# Patient Record
Sex: Female | Born: 1935 | Race: White | Hispanic: No | Smoking: Never smoker
Health system: Southern US, Community
[De-identification: ages and names within clinical notes are randomized; demographics above are authoritative.]

## PROBLEM LIST (undated history)

## (undated) DIAGNOSIS — Z9289 Personal history of other medical treatment: Secondary | ICD-10-CM

## (undated) DIAGNOSIS — K260 Acute duodenal ulcer with hemorrhage: Secondary | ICD-10-CM

## (undated) DIAGNOSIS — K635 Polyp of colon: Secondary | ICD-10-CM

## (undated) DIAGNOSIS — R351 Nocturia: Secondary | ICD-10-CM

## (undated) DIAGNOSIS — I10 Essential (primary) hypertension: Secondary | ICD-10-CM

## (undated) DIAGNOSIS — Z952 Presence of prosthetic heart valve: Secondary | ICD-10-CM

## (undated) DIAGNOSIS — D649 Anemia, unspecified: Secondary | ICD-10-CM

## (undated) DIAGNOSIS — K250 Acute gastric ulcer with hemorrhage: Secondary | ICD-10-CM

## (undated) DIAGNOSIS — J45909 Unspecified asthma, uncomplicated: Secondary | ICD-10-CM

## (undated) DIAGNOSIS — M1990 Unspecified osteoarthritis, unspecified site: Secondary | ICD-10-CM

## (undated) DIAGNOSIS — J189 Pneumonia, unspecified organism: Secondary | ICD-10-CM

## (undated) DIAGNOSIS — K219 Gastro-esophageal reflux disease without esophagitis: Secondary | ICD-10-CM

## (undated) DIAGNOSIS — R011 Cardiac murmur, unspecified: Secondary | ICD-10-CM

## (undated) HISTORY — PX: DILATION AND CURETTAGE OF UTERUS: SHX78

## (undated) HISTORY — PX: CATARACT EXTRACTION: SUR2

## (undated) HISTORY — PX: CARDIAC CATHETERIZATION: SHX172

---

## 1998-09-03 ENCOUNTER — Other Ambulatory Visit: Admission: RE | Admit: 1998-09-03 | Discharge: 1998-09-03 | Payer: Self-pay | Admitting: Urology

## 1998-09-04 ENCOUNTER — Other Ambulatory Visit: Admission: RE | Admit: 1998-09-04 | Discharge: 1998-09-04 | Payer: Self-pay | Admitting: Urology

## 2006-08-21 ENCOUNTER — Ambulatory Visit: Payer: Self-pay | Admitting: Vascular Surgery

## 2007-10-22 ENCOUNTER — Ambulatory Visit: Payer: Self-pay | Admitting: Vascular Surgery

## 2008-01-21 ENCOUNTER — Ambulatory Visit: Payer: Self-pay | Admitting: Vascular Surgery

## 2008-02-16 ENCOUNTER — Ambulatory Visit: Payer: Self-pay | Admitting: Vascular Surgery

## 2008-02-24 ENCOUNTER — Ambulatory Visit: Payer: Self-pay | Admitting: Vascular Surgery

## 2008-04-05 ENCOUNTER — Ambulatory Visit: Payer: Self-pay | Admitting: Vascular Surgery

## 2008-04-19 ENCOUNTER — Ambulatory Visit: Payer: Self-pay | Admitting: Vascular Surgery

## 2010-07-23 NOTE — Consult Note (Signed)
NEW PATIENT CONSULTATION   [name], [name]  DOB:  1928-11-01                                       08/21/2006  NWGNF#:[id number]   [name] presents today for evaluation of bilateral venous  pathology.  She is a very pleasant, healthy 77 year old white female  with a long history of progressive venous varicosities. She has had no  history of prior deep venous thrombosis, ulceration or bleeding.  She  had attempted saline injections for sclerotherapy 10 years ago with no  significant success.  She does not have any pain over the varicosities  and really does not have any significant leg pain bilaterally.   PAST MEDICAL HISTORY:  Significant for being married, three children.  She does not smoke or drink alcohol. She does have some arthritis and  hypertension.  She has no history of arterial insufficiency and no  history of cardiac disease.   PHYSICAL EXAMINATION:  GENERAL:  A well-developed, well-nourished white  female who appears her age of 67.  VITAL SIGNS:  Blood pressure 160/92, pulse 84, respiration 16.  Her  dorsalis pedis pulses are 2+ bilaterally. 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  She does have some very  pronounced telangiectasia in both anterior thighs and also scattered  telangiectasia throughout both legs.  She underwent hand held duplex  evaluation by me and this revealed gross reflux bilaterally in her  saphenous veins.   I discussed options with [name].  I explained that indications for  treatment would be pain or appearance.  I explained that this is not  leading her to any increased risk of deep venous thrombosis and any  significant medical problems as this would be slowly progressive.  She  is concerned regarding the appearance of these.  We discussed laser  ablation and stab phlebectomy of her tributary varicosities and  saline  injections.  Since she is not having any pain associated with these, we  explained that this would be out of pocket expense.  She wishes to defer  treatment at this time and was reassured that there is not major medical  difficulties regarding these.  She will notify us if she wishes to  proceed in the future or if she begins having discomfort related to her  varicosities.   Larina Earthly, M.D.  Electronically Signed   TFE/MEDQ  D:  08/21/2006  T:  08/21/2006  Job:  [id number]

## 2010-07-23 NOTE — Procedures (Signed)
DUPLEX DEEP VENOUS EXAM - LOWER EXTREMITY   INDICATION:  Right greater saphenous vein ablation on 02/16/2008.   HISTORY:  Edema:  No.  Trauma/Surgery:  Right greater saphenous vein ablation in December,  2009.  Pain:  Right leg.  PE:  No.  Previous DVT:  No.  Anticoagulants:  No.  Other:  No.   DUPLEX EXAM:                CFV   SFV   PopV  PTV    GSV                R  L  R  L  R  L  R   L  R  L  Thrombosis    o  o  o     o     o      +  o  Spontaneous   +  +  +     +     +      o  +  Phasic        +  +  +     +     +      o  +  Augmentation  +  +  +     +     +      o  +  Compressible  +  +  +     +     +      o  +  Competent     +  +  +     +     +      o  o   Legend:  + - yes  o - no  p - partial  D - decreased   IMPRESSION:  1. The right greater saphenous vein is thrombosed from the      saphenofemoral junction into the proximal calf.  2. No evidence of right leg deep venous thrombosis.  No deep venous      thrombosis seen in the right common femoral vein or circumflex      vein.  3. The left greater saphenous vein is incompetent.    _____________________________  Larina Earthly, M.D.   MC/MEDQ  D:  02/24/2008  T:  02/24/2008  Job:  [id number]

## 2010-07-23 NOTE — Procedures (Signed)
DUPLEX DEEP VENOUS EXAM - LOWER EXTREMITY   INDICATION:  Follow up left greater saphenous ablation.   HISTORY:  Edema:  No.  Trauma/Surgery:  Yes.  Pain:  No.  PE:  No.  Previous DVT:  No.  Anticoagulants:  Other:   DUPLEX EXAM:                CFV   SFV   PopV  PTV    GSV                R  L  R  L  R  L  R   L  R  L  Thrombosis    o  o     o     o      o     +  Spontaneous   +  +     +     +      +     0  Phasic        +  +     +     +      +     0  Augmentation  +  +     +     +      +     0  Compressible  +  +     +     +      +     0  Competent     +  +     +     +      +     0   Legend:  + - yes  o - no  p - partial  D - decreased   IMPRESSION:  1. No evidence of deep venous thrombosis noted in the left leg.  2. The left greater saphenous vein appears ablated from saphenofemoral      junction to distal thigh.    _____________________________  Larina Earthly, M.D.   MG/MEDQ  D:  04/19/2008  T:  04/19/2008  Job:  [id number]

## 2010-07-23 NOTE — Assessment & Plan Note (Signed)
OFFICE VISIT   CHAUNDRA, ABREU  DOB:  1928/03/25                                       01/21/2008  UEAVW#:[id number]   The patient presents today for continued followup of her venous  pathology.  She is a very pleasant 77 year old white female with a long  history of progressive venous disease.  I had seen her 3 months ago  after she had a significant bleed from a pretibial varicosity on the  right leg.  She has had no further bleeding, but does have several areas  on her pretibial area that are extremely thin that look as though they  would be prone to hemorrhage.  I again discussed treatment acutely if  she does have bleeding.  She does report that she has had a heavy  sensation and pain bilaterally, more so in her calves with prolonged  standing, making ADL's difficult.  It's also difficult for her to get  into a squatting postion, which makes housework and yardwork difficult.  Leg pain prevents her from getting a good night's sleep.  Also, sitting  is very painful making traveling in a car very difficult. She has  tributary varicosities over her left thigh, lateral knee, and pretibial  area.  She underwent a formal duplex today, and this confirms gross  reflux throughout her saphenous vein bilaterally.  She also has an  anterior branch on the left leg, which is feeding into the tributary  varicosities.  I discussed options with the patient.  I recommend that  we proceed with right leg laser ablation and sclerotherapy of these  areas of very superficial reticular veins to hopefully reduce her risk  for recurrent bleeding.  I have also explained the option of left leg  ablation for pain relief.  She understands this and we will proceed  initially with right leg followed by left leg treatment.   Larina Earthly, M.D.  Electronically Signed   TFE/MEDQ  D:  01/21/2008  T:  01/24/2008  Job:  [id number]   cc:   Isabelle Course

## 2010-07-23 NOTE — Procedures (Signed)
LOWER EXTREMITY VENOUS REFLUX EXAM   INDICATION:  Bilateral legs varicose vein with pain and swelling.   EXAM:  Using color-flow imaging and pulse Doppler spectral analysis, the  right and left common femoral vein, superficial femoral, popliteal,  posterior tibial, greater and lesser saphenous veins are evaluated.  There is no evidence suggesting deep venous insufficiency in the right  and left lower extremity.   The right saphenofemoral junction is not competent.  The right and left  GSV is not competent with the caliber as described below.   The right and left proximal short saphenous veins demonstrate  competency.   GSV Diameter (used if found to be incompetent only)                                            Right    Left  Proximal Greater Saphenous Vein           0.60 cm  0.38 cm  Proximal-to-mid-thigh                     0.60 cm  0.38 cm  Mid thigh                                 0.47 cm  0.37 cm  Mid-distal thigh                          0.47 cm  0.37 cm  Distal thigh                              0.47 cm  0.37 cm  Knee                                      0.35 cm  0.37 cm   IMPRESSION:  1. Right and left greater saphenous vein reflux is identified with the      caliber ranging from on the right 0.35 cm to 1.03 cm knee to groin      and on the left is 0.37 cm to 0.70 cm knee-to-groin.  2. The right and left greater saphenous vein is not aneurysmal.  3. The right and left greater saphenous vein is not tortuous.  4. The deep venous system is competent.  5. The right and left lesser saphenous vein is competent.  6. No evidence of DVT noted in bilateral legs.       ___________________________________________  Larina Earthly, M.D.   MG/MEDQ  D:  01/21/2008  T:  01/21/2008  Job:  [id number]

## 2010-07-23 NOTE — Assessment & Plan Note (Signed)
OFFICE VISIT   PETRITA, BLUNCK  DOB:  [birth year]                                       04/19/2008  [id number]#:[id number]   The patient presents today for 2 week followup after laser ablation and  stab phlebectomy of her left great saphenous vein and tributary  varicosities.  She had undergone a right leg treatment on 02/16/2008.  She has done quite well with her left leg just as she did her right.  She had multiple stab phlebectomies and these incisions are all healing  quite nicely.  She also had some sclerotherapy to the anterior thighs  bilaterally and these are resolving as expected 2 weeks out.  She did  have a slight amount of blood trapping in the sclerotherapy sites and  this was evacuated with puncture of an 18 gauge needle.  The patient is  quite pleased with her result as am I.  She will continue her usual  activities and I plan to see her again on an as-needed basis.   Larina Earthly, M.D.  Electronically Signed   TFE/MEDQ  D:  04/19/2008  T:  04/20/2008  Job:  [id number]   cc:   Isabelle Course

## 2010-07-23 NOTE — Assessment & Plan Note (Signed)
OFFICE VISIT   [name], [name]  DOB:  1928/10/02                                       10/22/2007  EAVWU#:[id number]   The patient presents for a continued evaluation of her severe venous  hypertension.  Since my last visit with her, she has had progression.  She had a recent event of a major bleed from her varix at her right  pretibial area in the shower.  This was controlled eventually with  compression.  She has multiple areas in this region that appear to be  prone to bleeding, with very thin skin over a very superficial prominent  varix.  She does have more prominent standard type varicosities over her  left leg, throughout her distal thigh and proximal calf.  She does  report aching sensation with prolonged standing and this is equal right  and left.  She does elevate her legs when possible and this gives slight  relief.  She has not worn graduated compression garments.  She reports a  tired heavy sensation with throbbing and itching sensation and skin  changes over her legs bilaterally.   MEDICATIONS:  Currently are Evista, hydrochlorothiazide, Toprol, and  Tylenol for discomfort.   She is allergic to penicillin.   PHYSICAL EXAMINATION:  A well-developed, well-nourished white female  appearing younger than stated age of 26.  Her blood pressure 147/86,  heart rate is 104, respirations 18.  Her dorsalis pedis pulses are 2+  bilaterally.  She does have marked saphenous vein tributary varicosities  bilaterally and multiple spider vein telangiectasia.  The area of prior  bleed on the right pretibial area is healing with a scab present.  I  discussed options with the patient.  We have fitted her with graduated  compression stockings, thigh-high, 20-30 mmHg a day and instructed her  on their use.  We will see her again in 3 months to determine if this  conservative treatment is improving her symptoms and is preventing  recurrent bleeding.  I instructed  her on care should she develop ongoing  bleeding as well.   Larina Earthly, M.D.  Electronically Signed   TFE/MEDQ  D:  10/22/2007  T:  10/25/2007  Job:  [id number]   cc:   Isabelle Course

## 2010-07-23 NOTE — Assessment & Plan Note (Signed)
OFFICE VISIT   [name], [name]  DOB:  [birth year]                                       02/24/2008  [id number]#:[id number]   The patient presents today for 1 week followup of her laser ablation of  her right great saphenous vein and sclerotherapy treatment of the areas  of the tributary varicosities that had bled in her distal calf.  She has  done quite well.  She has the usual amount of soreness from the  procedure.  She underwent repeat duplex today and this reveals closure  of her saphenous vein from the mid calf up to the saphenofemoral  junction and no evidence of deep venous thrombosis.  I am quite pleased  with her initial result as is the patient.  She continues to have pain  in her left leg.  She has not had any bleeding but does have pain  associated with prolonged standing over the significant varicosities  over her anterior thigh and anterior pretibial area.  She continues to  have pain despite the use of graduated compression garments.  I have  recommended that we proceed with laser ablation of her great saphenous  vein for reduction of her venous hypertension and stab phlebectomy of  the tributary varicosities.  She wishes to proceed with this after the  first of the year.   Larina Earthly, M.D.  Electronically Signed   TFE/MEDQ  D:  02/24/2008  T:  02/24/2008  Job:  [id number]

## 2012-11-18 HISTORY — PX: TISSUE AORTIC VALVE REPLACEMENT: SHX2527

## 2013-01-17 ENCOUNTER — Encounter (HOSPITAL_COMMUNITY): Payer: Self-pay | Admitting: Emergency Medicine

## 2013-01-17 ENCOUNTER — Inpatient Hospital Stay (HOSPITAL_COMMUNITY)
Admission: EM | Admit: 2013-01-17 | Discharge: 2013-01-19 | DRG: 378 | Disposition: A | Discharge status: Home / Self Care | Payer: Medicare Other | Attending: Family Medicine | Admitting: Family Medicine

## 2013-01-17 DIAGNOSIS — Z8052 Family history of malignant neoplasm of bladder: Secondary | ICD-10-CM

## 2013-01-17 DIAGNOSIS — K260 Acute duodenal ulcer with hemorrhage: Secondary | ICD-10-CM | POA: Diagnosis present

## 2013-01-17 DIAGNOSIS — Z860100 Personal history of colon polyps, unspecified: Secondary | ICD-10-CM

## 2013-01-17 DIAGNOSIS — K922 Gastrointestinal hemorrhage, unspecified: Secondary | ICD-10-CM | POA: Diagnosis present

## 2013-01-17 DIAGNOSIS — I2510 Atherosclerotic heart disease of native coronary artery without angina pectoris: Secondary | ICD-10-CM | POA: Diagnosis present

## 2013-01-17 DIAGNOSIS — N179 Acute kidney failure, unspecified: Secondary | ICD-10-CM | POA: Diagnosis present

## 2013-01-17 DIAGNOSIS — K257 Chronic gastric ulcer without hemorrhage or perforation: Secondary | ICD-10-CM | POA: Diagnosis present

## 2013-01-17 DIAGNOSIS — J309 Allergic rhinitis, unspecified: Secondary | ICD-10-CM | POA: Diagnosis present

## 2013-01-17 DIAGNOSIS — Z79899 Other long term (current) drug therapy: Secondary | ICD-10-CM

## 2013-01-17 DIAGNOSIS — J329 Chronic sinusitis, unspecified: Secondary | ICD-10-CM | POA: Diagnosis present

## 2013-01-17 DIAGNOSIS — IMO0002 Reserved for concepts with insufficient information to code with codable children: Secondary | ICD-10-CM | POA: Diagnosis present

## 2013-01-17 DIAGNOSIS — K250 Acute gastric ulcer with hemorrhage: Secondary | ICD-10-CM | POA: Diagnosis present

## 2013-01-17 DIAGNOSIS — Z602 Problems related to living alone: Secondary | ICD-10-CM

## 2013-01-17 DIAGNOSIS — D62 Acute posthemorrhagic anemia: Secondary | ICD-10-CM | POA: Diagnosis present

## 2013-01-17 DIAGNOSIS — Z954 Presence of other heart-valve replacement: Secondary | ICD-10-CM

## 2013-01-17 DIAGNOSIS — I10 Essential (primary) hypertension: Secondary | ICD-10-CM | POA: Diagnosis present

## 2013-01-17 DIAGNOSIS — T39095A Adverse effect of salicylates, initial encounter: Secondary | ICD-10-CM | POA: Diagnosis present

## 2013-01-17 DIAGNOSIS — Z880 Allergy status to penicillin: Secondary | ICD-10-CM

## 2013-01-17 DIAGNOSIS — R55 Syncope and collapse: Secondary | ICD-10-CM | POA: Diagnosis present

## 2013-01-17 DIAGNOSIS — K269 Duodenal ulcer, unspecified as acute or chronic, without hemorrhage or perforation: Secondary | ICD-10-CM | POA: Diagnosis present

## 2013-01-17 DIAGNOSIS — Z8249 Family history of ischemic heart disease and other diseases of the circulatory system: Secondary | ICD-10-CM

## 2013-01-17 DIAGNOSIS — W19XXXA Unspecified fall, initial encounter: Secondary | ICD-10-CM | POA: Diagnosis present

## 2013-01-17 DIAGNOSIS — E785 Hyperlipidemia, unspecified: Secondary | ICD-10-CM | POA: Diagnosis present

## 2013-01-17 DIAGNOSIS — Z9849 Cataract extraction status, unspecified eye: Secondary | ICD-10-CM

## 2013-01-17 DIAGNOSIS — K2961 Other gastritis with bleeding: Principal | ICD-10-CM | POA: Diagnosis present

## 2013-01-17 DIAGNOSIS — Z952 Presence of prosthetic heart valve: Secondary | ICD-10-CM

## 2013-01-17 DIAGNOSIS — K921 Melena: Secondary | ICD-10-CM | POA: Diagnosis present

## 2013-01-17 DIAGNOSIS — Y92009 Unspecified place in unspecified non-institutional (private) residence as the place of occurrence of the external cause: Secondary | ICD-10-CM

## 2013-01-17 DIAGNOSIS — E861 Hypovolemia: Secondary | ICD-10-CM | POA: Diagnosis present

## 2013-01-17 DIAGNOSIS — K920 Hematemesis: Secondary | ICD-10-CM | POA: Diagnosis present

## 2013-01-17 HISTORY — DX: Acute gastric ulcer with hemorrhage: K25.0

## 2013-01-17 HISTORY — DX: Pneumonia, unspecified organism: J18.9

## 2013-01-17 HISTORY — DX: Acute duodenal ulcer with hemorrhage: K26.0

## 2013-01-17 HISTORY — DX: Polyp of colon: K63.5

## 2013-01-17 HISTORY — DX: Unspecified osteoarthritis, unspecified site: M19.90

## 2013-01-17 HISTORY — DX: Essential (primary) hypertension: I10

## 2013-01-17 LAB — CBC WITH DIFFERENTIAL/PLATELET
Basophils Absolute: 0.1 10*3/uL (ref 0.0–0.1)
Basophils Relative: 1 % (ref 0–1)
Eosinophils Absolute: 0 10*3/uL (ref 0.0–0.7)
Eosinophils Relative: 0 % (ref 0–5)
HCT: 26.1 % — ABNORMAL LOW (ref 36.0–46.0)
Hemoglobin: 8.5 g/dL — ABNORMAL LOW (ref 12.0–15.0)
Lymphocytes Relative: 6 % — ABNORMAL LOW (ref 12–46)
Lymphs Abs: 0.5 10*3/uL — ABNORMAL LOW (ref 0.7–4.0)
MCH: 28.3 pg (ref 26.0–34.0)
MCHC: 32.6 g/dL (ref 30.0–36.0)
MCV: 87 fL (ref 78.0–100.0)
Monocytes Absolute: 0.3 10*3/uL (ref 0.1–1.0)
Monocytes Relative: 4 % (ref 3–12)
Neutro Abs: 8.1 10*3/uL — ABNORMAL HIGH (ref 1.7–7.7)
Neutrophils Relative %: 90 % — ABNORMAL HIGH (ref 43–77)
Platelets: 376 10*3/uL (ref 150–400)
RBC: 3 MIL/uL — ABNORMAL LOW (ref 3.87–5.11)
RDW: 14.7 % (ref 11.5–15.5)
WBC: 9 10*3/uL (ref 4.0–10.5)

## 2013-01-17 LAB — URINALYSIS, ROUTINE W REFLEX MICROSCOPIC
Bilirubin Urine: NEGATIVE
Glucose, UA: NEGATIVE mg/dL
Ketones, ur: NEGATIVE mg/dL
Nitrite: NEGATIVE
Protein, ur: NEGATIVE mg/dL
Specific Gravity, Urine: 1.015 (ref 1.005–1.030)
Urobilinogen, UA: 0.2 mg/dL (ref 0.0–1.0)
pH: 6 (ref 5.0–8.0)

## 2013-01-17 LAB — URINE MICROSCOPIC-ADD ON

## 2013-01-17 LAB — COMPREHENSIVE METABOLIC PANEL
ALT: 14 U/L (ref 0–35)
AST: 28 U/L (ref 0–37)
Albumin: 3.1 g/dL — ABNORMAL LOW (ref 3.5–5.2)
Alkaline Phosphatase: 42 U/L (ref 39–117)
BUN: 35 mg/dL — ABNORMAL HIGH (ref 6–23)
CO2: 22 mEq/L (ref 19–32)
Calcium: 9.2 mg/dL (ref 8.4–10.5)
Chloride: 107 mEq/L (ref 96–112)
Creatinine, Ser: 1.21 mg/dL — ABNORMAL HIGH (ref 0.50–1.10)
GFR calc Af Amer: 47 mL/min — ABNORMAL LOW (ref >90)
GFR calc non Af Amer: 41 mL/min — ABNORMAL LOW (ref >90)
Glucose, Bld: 116 mg/dL — ABNORMAL HIGH (ref 70–99)
Potassium: 4.5 mEq/L (ref 3.5–5.1)
Sodium: 140 mEq/L (ref 135–145)
Total Bilirubin: 0.3 mg/dL (ref 0.3–1.2)
Total Protein: 6.9 g/dL (ref 6.0–8.3)

## 2013-01-17 LAB — OCCULT BLOOD, POC DEVICE: Fecal Occult Bld: POSITIVE — AB

## 2013-01-17 LAB — ABO/RH: ABO/RH(D): A NEG

## 2013-01-17 LAB — PROTIME-INR
INR: 1.24 (ref 0.00–1.49)
Prothrombin Time: 15.3 seconds — ABNORMAL HIGH (ref 11.6–15.2)

## 2013-01-17 LAB — LIPASE, BLOOD: Lipase: 35 U/L (ref 11–59)

## 2013-01-17 LAB — APTT: aPTT: 28 seconds (ref 24–37)

## 2013-01-17 MED ORDER — SODIUM CHLORIDE 0.9 % IV SOLN
80.0000 mg | Freq: Once | INTRAVENOUS | Status: AC
  Administered 2013-01-17: 80 mg via INTRAVENOUS
  Filled 2013-01-17: qty 80

## 2013-01-17 MED ORDER — ONDANSETRON HCL 4 MG PO TABS: 4.0000 mg | ORAL_TABLET | Freq: Four times a day (QID) | ORAL | Status: DC | PRN

## 2013-01-17 MED ORDER — SODIUM CHLORIDE 0.9 % IV SOLN
80.0000 mg | Freq: Once | INTRAVENOUS | Status: DC
  Administered 2013-01-17: 80 mg via INTRAVENOUS
  Filled 2013-01-17: qty 80

## 2013-01-17 MED ORDER — PANTOPRAZOLE SODIUM 40 MG IV SOLR: 40.0000 mg | Freq: Two times a day (BID) | INTRAVENOUS | Status: DC

## 2013-01-17 MED ORDER — SODIUM CHLORIDE 0.9 % IV SOLN
INTRAVENOUS | Status: DC
  Administered 2013-01-17: 20:00:00 via INTRAVENOUS

## 2013-01-17 MED ORDER — ONDANSETRON HCL 4 MG/2ML IJ SOLN: 4.0000 mg | Freq: Four times a day (QID) | INTRAMUSCULAR | Status: DC | PRN

## 2013-01-17 MED ORDER — SODIUM CHLORIDE 0.9 % IJ SOLN
3.0000 mL | Freq: Two times a day (BID) | INTRAMUSCULAR | Status: DC
  Administered 2013-01-18 – 2013-01-19 (×2): 3 mL via INTRAVENOUS

## 2013-01-17 MED ORDER — ACETAMINOPHEN 325 MG PO TABS
650.0000 mg | ORAL_TABLET | Freq: Once | ORAL | Status: AC
  Administered 2013-01-17: 650 mg via ORAL
  Filled 2013-01-17: qty 2

## 2013-01-17 MED ORDER — METOPROLOL TARTRATE 50 MG PO TABS
50.0000 mg | ORAL_TABLET | Freq: Every day | ORAL | Status: DC
  Administered 2013-01-18 – 2013-01-19 (×2): 50 mg via ORAL
  Filled 2013-01-17 (×2): qty 1

## 2013-01-17 MED ORDER — SODIUM CHLORIDE 0.9 % IV BOLUS (SEPSIS)
500.0000 mL | Freq: Once | INTRAVENOUS | Status: AC
  Administered 2013-01-17: 500 mL via INTRAVENOUS

## 2013-01-17 MED ORDER — SIMVASTATIN 10 MG PO TABS
10.0000 mg | ORAL_TABLET | Freq: Every day | ORAL | Status: DC
  Administered 2013-01-17: 10 mg via ORAL
  Filled 2013-01-17 (×3): qty 1

## 2013-01-17 MED ORDER — OXYCODONE HCL 5 MG PO TABS: 5.0000 mg | ORAL_TABLET | Freq: Four times a day (QID) | ORAL | Status: DC | PRN

## 2013-01-17 MED ORDER — SODIUM CHLORIDE 0.9 % IV SOLN
8.0000 mg/h | INTRAVENOUS | Status: DC
  Administered 2013-01-17 – 2013-01-18 (×2): 8 mg/h via INTRAVENOUS
  Filled 2013-01-17 (×4): qty 80

## 2013-01-17 MED ORDER — ACETAMINOPHEN 325 MG PO TABS
325.0000 mg | ORAL_TABLET | Freq: Every day | ORAL | Status: DC | PRN
  Administered 2013-01-17 – 2013-01-19 (×2): 325 mg via ORAL
  Filled 2013-01-17 (×2): qty 1

## 2013-01-17 MED ORDER — SODIUM CHLORIDE 0.9 % IV SOLN
8.0000 mg/h | INTRAVENOUS | Status: DC
  Filled 2013-01-17 (×2): qty 80

## 2013-01-17 NOTE — ED Notes (Signed)
Pt c/o black tarry stools this am and then had some coffee ground emesis today; pt sts hx of GI issues x 1 week

## 2013-01-17 NOTE — ED Notes (Signed)
Family at bedside. 

## 2013-01-17 NOTE — ED Provider Notes (Signed)
CSN: [id number]     Arrival date & time 01/17/13  1401 History   First MD Initiated Contact with Patient 01/17/13 1514     Chief Complaint  Patient presents with  . GI Bleeding   (Consider location/radiation/quality/duration/timing/severity/associated sxs/prior Treatment) HPI Patient presents to the emergency department with rectal bleeding, and vomiting, blood that started this morning.  Patient denies a history of GI bleeding.  Patient, states she's not on any blood thinning medications other than an aspirin a day.  The patient denies chest pain, shortness of breath, nausea, headache, blurred vision, weakness, numbness, dizziness, fever, or back pain.  Patient, states she did pass out earlier today following a vomiting episode of dark bloody emesis.  Patient, states she's been having the GI irritation over the last month Past Medical History  Diagnosis Date  . Coronary artery disease   . Hypertension    History reviewed. No pertinent past surgical history. History reviewed. No pertinent family history. History  Substance Use Topics  . Smoking status: Never Smoker   . Smokeless tobacco: Not on file  . Alcohol Use: No   OB History   Grav Para Term Preterm Abortions TAB SAB Ect Mult Living                 Review of Systems All other systems negative except as documented in the HPI. All pertinent positives and negatives as reviewed in the HPI. Allergies  Penicillins  Home Medications   Current Outpatient Rx  Name  Route  Sig  Dispense  Refill  . acetaminophen (TYLENOL) 325 MG tablet   Oral   Take 325 mg by mouth daily as needed (pain).         Marland Kitchen aspirin EC 325 MG tablet   Oral   Take 325 mg by mouth daily.         . calcium carbonate (TUMS - DOSED IN MG ELEMENTAL CALCIUM) 500 MG chewable tablet   Oral   Chew 1 tablet by mouth daily as needed for indigestion or heartburn.         . fenofibrate micronized (LOFIBRA) 200 MG capsule   Oral   Take 200 mg by mouth  daily.          . metoprolol (LOPRESSOR) 100 MG tablet   Oral   Take 50 mg by mouth daily.          . pravastatin (PRAVACHOL) 20 MG tablet   Oral   Take 20 mg by mouth at bedtime.          . Vitamin D, Ergocalciferol, (DRISDOL) 50000 UNITS CAPS capsule   Oral   Take 50,000 Units by mouth every 7 (seven) days. Saturdays          BP 95/74  Pulse 82  Temp(Src) 97.8 F (36.6 C) (Oral)  Resp 20  Wt 115 lb (52.164 kg)  SpO2 99% Physical Exam  Nursing note and vitals reviewed. Constitutional: She is oriented to person, place, and time. She appears well-developed and well-nourished. No distress.  HENT:  Head: Normocephalic and atraumatic.  Mouth/Throat: Oropharynx is clear and moist.  Eyes: Pupils are equal, round, and reactive to light.  Neck: Normal range of motion. Neck supple.  Cardiovascular: Normal rate, regular rhythm and normal heart sounds.  Exam reveals no gallop and no friction rub.   No murmur heard. Pulmonary/Chest: Effort normal and breath sounds normal. She has no wheezes.  Abdominal: Soft. Bowel sounds are normal. She exhibits no distension. There  is no tenderness. There is no rebound.  Neurological: She is alert and oriented to person, place, and time. She exhibits normal muscle tone. Coordination normal.  Skin: Skin is warm and dry. No rash noted. There is pallor.    ED Course  Procedures (including critical care time) Labs Review Labs Reviewed  CBC WITH DIFFERENTIAL - Abnormal; Notable for the following:    RBC 3.00 (*)    Hemoglobin 8.5 (*)    HCT 26.1 (*)    Neutrophils Relative % 90 (*)    Neutro Abs 8.1 (*)    Lymphocytes Relative 6 (*)    Lymphs Abs 0.5 (*)    All other components within normal limits  COMPREHENSIVE METABOLIC PANEL - Abnormal; Notable for the following:    Glucose, Bld 116 (*)    BUN 35 (*)    Creatinine, Ser 1.21 (*)    Albumin 3.1 (*)    GFR calc non Af Amer 41 (*)    GFR calc Af Amer 47 (*)    All other components  within normal limits  PROTIME-INR - Abnormal; Notable for the following:    Prothrombin Time 15.3 (*)    All other components within normal limits  OCCULT BLOOD, POC DEVICE - Abnormal; Notable for the following:    Fecal Occult Bld POSITIVE (*)    All other components within normal limits  LIPASE, BLOOD  APTT  URINALYSIS, ROUTINE W REFLEX MICROSCOPIC  TYPE AND SCREEN  ABO/RH   The patient will be admitted to the hospital for further evaluation and care of her GI bleeding.  Patient's been stable here in the emergency department.  She is given IV protonix  MDM   1. GI bleeding        Carlyle Dolly, PA-C 01/18/13 [phone]

## 2013-01-17 NOTE — H&P (Deleted)
[facility] History and Physical  [name] WUJ:[id number] DOB: 08/27/1917 DOA: 01/15/2013  Referring physician:  PCP: Cassell Clement, MD  Specialists:   Chief Complaint:   HPI: [name] is a 79 y.o. female with a past medical history of hypertension, dyslipidemia, recently underwent you aortic valve replacement [facility] with porcine valve, who presents emergency department with complaints of hematemesis and melena. She states feeling weak, dizzy, having poor tolerance to physical exertion yesterday. Was unable to attend church. Then this morning she had several episodes of hematemesis, characterizes coffee-ground emesis. Patient also had episode of melena. She had a syncopal event, was unconscious for approximately 10 seconds after which her son him he called EMS and was brought to the emergency department. On arrival she was hemodynamically stable, with lab work showing a hemoglobin of 8.5 and INR of 1.2. She presently denies chest pain, shortness of breath, dizziness, lightheadedness, and has not had witnessed episodes of GI bleed while in the emergency room. I discussed case with lab our gastroenterology, agreed with starting protonic strip and discontinuing aspirin therapy. Patient is not on anticoagulants. She denies taking any other NSAIDs other than daily aspirin. Denies taking BC powders and Goody powders.                                                    Review of Systems: The patient denies anorexia, fever, weight loss,, vision loss, decreased hearing, hoarseness, chest pain, dyspnea on exertion, peripheral edema, balance deficits, hemoptysis, severe indigestion/heartburn, hematuria, incontinence, genital sores, muscle weakness, suspicious skin lesions, transient blindness, difficulty walking, depression, unusual weight change, enlarged lymph nodes, angioedema, and breast masses.   Past Medical History  Diagnosis Date  . Balance problem     WHEEL CHAIR BOUND   . Shoulder pain     receives shoulder injections  . Bleeding hemorrhoids   . CHF (congestive heart failure)     Echocardiogram 06/16/11 revealed EF 55-60%, mild LVH, no RWMAs, mild AS, RV w/ mod dysfunction, mod dilated LA/RA.  Marland Kitchen Aortic stenosis     Echocardiogram 06/16/11 revealed EF 55-60%, mild LVH, no RWMAs, mild AS, RV w/ mod dysfunction, mod dilated LA/RA.  Marland Kitchen Osteoarthritis   . Hyponatremia     06/2011  . PAF (paroxysmal atrial fibrillation)     amiodarone stopped May 2013 due to chronic nausea; managed with rate control  . Hypertension   . Hypothyroidism   . Advanced age   . Anemia   . GERD (gastroesophageal reflux disease)    Past Surgical History  Procedure Laterality Date  . Replacement total knee      LEFT KNEE  . Abdominal hysterectomy  1970    total  . Thyroidectomy  1989   Social History:  reports that she has never smoked. She has never used smokeless tobacco. She reports that she does not drink alcohol or use illicit drugs. Patient presently resides home alone.  Allergies  Allergen Reactions  . Crestor [Rosuvastatin Calcium] Shortness Of Breath    unknown  . Promethazine Shortness Of Breath  . Budesonide-Formoterol Fumarate Other (See Comments)    Rapid heartbeat  . Caffeine Other (See Comments)    REACTION: rapid heartbeat  . Fluticasone-Salmeterol Other (See Comments)    Rapid heartbeat  . Medrol [Methylprednisolone] Other (See Comments)    GI upset &  ulcers in the past  . Morphine And Related     Pt reports bad reaction.  . Nsaids Other (See Comments)    GI upset & ulcers in the past  . Ondansetron     Confusion   . Prednisone Nausea And Vomiting    REACTION: severe nausea and vomiting    Family History  Problem Relation Age of Onset  . Cancer Sister     ovarian     Prior to Admission medications   Medication Sig Start Date End Date Taking? Authorizing Provider  acetaminophen (TYLENOL) 325 MG tablet Take 650 mg by mouth daily with lunch.     Yes Historical Provider, MD  amLODipine (NORVASC) 5 MG tablet Take 5 mg by mouth daily with supper.    Yes Historical Provider, MD  aspirin 81 MG EC tablet Take 81 mg by mouth at bedtime.    Yes Historical Provider, MD  atorvastatin (LIPITOR) 10 MG tablet Take 10 mg by mouth at bedtime.   Yes Historical Provider, MD  bisoprolol (ZEBETA) 5 MG tablet Take 5 mg by mouth daily.   Yes Historical Provider, MD  cephALEXin (KEFLEX) 250 MG capsule Take 250 mg by mouth daily.  12/19/12  Yes Historical Provider, MD  cetirizine (ZYRTEC) 10 MG tablet Take 10 mg by mouth daily.     Yes Historical Provider, MD  Cholecalciferol (VITAMIN D) 2000 UNITS tablet Take 2,000 Units by mouth daily.   Yes Historical Provider, MD  diclofenac sodium (VOLTAREN) 1 % GEL Apply 2 g topically 2 (two) times daily. Apply to shoulders 06/01/12  Yes Erick Colace, MD  digoxin (LANOXIN) 0.125 MG tablet Take 0.032 mg by mouth daily.   Yes Historical Provider, MD  diltiazem (CARDIZEM CD) 360 MG 24 hr capsule Take 360 mg by mouth daily.   Yes Historical Provider, MD  esomeprazole (NEXIUM) 40 MG capsule Take 40 mg by mouth daily with breakfast. 06/08/12  Yes Cassell Clement, MD  ferrous sulfate 325 (65 FE) MG tablet Take 325 mg by mouth daily with lunch. 10/28/11 01/15/13 Yes Cassell Clement, MD  furosemide (LASIX) 40 MG tablet Take 80 mg by mouth 2 (two) times daily.  04/14/12  Yes Cassell Clement, MD  [place] [facility] CAPS Take 1 capsule by mouth daily.   Yes Historical Provider, MD  levothyroxine (SYNTHROID, LEVOTHROID) 150 MCG tablet Take 1 tablet (150 mcg total) by mouth daily before breakfast. 11/03/12  Yes Jeralyn Bennett, MD  liothyronine (CYTOMEL) 25 MCG tablet Take 12.5 mcg by mouth daily before breakfast. Takes 1/2 hour before breakfast 02/11/12  Yes Cassell Clement, MD  liothyronine (CYTOMEL) 25 MCG tablet Take 12.5 mcg by mouth daily.   Yes Historical Provider, MD  Multiple Vitamin (MULTIVITAMIN WITH MINERALS) TABS tablet Take 1  tablet by mouth daily.   Yes Historical Provider, MD  potassium chloride SA (K-DUR,KLOR-CON) 20 MEQ tablet Take 20 mEq by mouth 2 (two) times daily.   Yes Historical Provider, MD  PRESCRIPTION MEDICATION Place 0.5 patches onto the skin at bedtime. Patch cut in half.  Half on right shoulder, half on left shoulder.  Worn for 12 hours and then removed.   Yes Historical Provider, MD  PROCTOZONE-HC 2.5 % rectal cream Place 1 application rectally daily.  11/05/12  Yes Historical Provider, MD  traMADol (ULTRAM) 50 MG tablet Take 50 mg by mouth Twice daily.  11/07/11  Yes Historical Provider, MD  triamcinolone cream (KENALOG) 0.1 % Apply 1 application topically 2 (two) times daily.  Yes Historical Provider, MD  nitroGLYCERIN (NITROSTAT) 0.4 MG SL tablet Place 0.4 mg under the tongue every 5 (five) minutes as needed. For chest pain    Historical Provider, MD   Physical Exam: Filed Vitals:   01/17/13 1600  BP: 126/62  Pulse:   Temp:   Resp:      General:  Pale appearing, no acute distress awake alert and oriented x3  Eyes: Pupils are equal round reactive to light extraocular movements are  Neck: Supple symmetrical no jugular venous distention or carotid bruits  Cardiovascular: Regular rate and rhythm normal S1-S2 no extremity edema  Respiratory: Lungs are clear to auscultation bilaterally no wheezing rhonchi or rales  Abdomen: Patient having a benign abdominal examination, positive bowel sounds, no guarding or rebound tenderness  Skin: No rashes or lesions  Musculoskeletal: Present range of motion to all extremities  Psychiatric: Patient is awake alert and oriented x3  Neurologic: Nonfocal neurologic examination cranial nerves II through XII grossly intact no alteration to sensation global 5 of 5 muscle strength  Labs on Admission:  Basic Metabolic Panel:  Recent Labs Lab 01/15/13 1425 01/17/13 0130 01/17/13 0630 01/17/13 1320  NA 136 142  --   --   K 3.9 2.6*  --  3.3*  CL 93*  102  --   --   CO2 32 29  --   --   GLUCOSE 182* 102*  --   --   BUN 42* 34*  --   --   CREATININE 1.17* 0.93  --   --   CALCIUM 8.4 7.8*  --   --   MG  --   --  1.9  --    Liver Function Tests:  Recent Labs Lab 01/15/13 1425  AST 17  ALT 11  ALKPHOS 53  BILITOT 0.4  PROT 7.4  ALBUMIN 3.3*   No results found for this basename: LIPASE, AMYLASE,  in the last 168 hours No results found for this basename: AMMONIA,  in the last 168 hours CBC:  Recent Labs Lab 01/15/13 1425  01/16/13 1357 01/16/13 1828 01/16/13 2125 01/17/13 0130 01/17/13 0630  WBC 12.1*  --  11.4*  --   --  10.8*  --   NEUTROABS 9.5*  --   --   --   --   --   --   HGB 10.0*  < > 10.4* 9.9* 10.3* 9.5* 9.8*  HCT 29.0*  < > 29.6* 28.4* 30.2* 27.7* 28.4*  MCV 93.5  --  90.2  --   --  89.9  --   PLT 254  --  200  --   --  180  --   < > = values in this interval not displayed. Cardiac Enzymes:  Recent Labs Lab 01/15/13 1425  TROPONINI <0.30    BNP (last 3 results)  Recent Labs  10/07/12 1623 10/24/12 1613 01/15/13 1425  PROBNP 2717.0* 3547.0* 1471.0*   CBG: No results found for this basename: GLUCAP,  in the last 168 hours  Radiological Exams on Admission: Nm Gi Blood Loss  01/16/2013   CLINICAL DATA:  Active GI bleeding  EXAM: NUCLEAR MEDICINE GASTROINTESTINAL BLEEDING SCAN  TECHNIQUE: Sequential abdominal images were obtained following intravenous administration of Tc-39m labeled red blood cells.  COMPARISON:  None.  RADIOPHARMACEUTICALS:  CURIE ULTRATAG TECHNETIUM TC 56M-LABELED RED BLOOD CELLS IV KITmCi Tc-108m in-vitro labeled red cells.  FINDINGS: There is tubular shaped focus of moderate increased uptake in the left upper quadrant  of the abdomen. This conforms to the expected distribution of the mid and distal transverse colon. This increases intensity over time and demonstrates antegrade flow over time.  IMPRESSION: Examination is positive for active GI bleed involving the mid to  distal transverse colon.   Electronically Signed   By: Signa Kell M.D.   On: 01/16/2013 00:18     Assessment/Plan Active Problems:   GI bleed   PAROXYSMAL ATRIAL FIBRILLATION   Aortic stenosis   Weakness   CHF, chronic-echo April 2013 shows preserved LV function and mild LVH, no diastolic dysfunction   Leukocytosis   1. Probable upper GI bleed. Patient presenting with episode of hematemesis as well as black tarry stools. Presents hemodynamically stable, having hemoglobin of 8.5. INR was checked in emergent apartment, back at 1.2. She is presently not on anticoagulants however had been taking daily aspirin. I discussed case with gastroenterology who will consult. Place patient on clears, make her n.p.o. after midnight, will likely undergo upper endoscopy in a.m. Will start patient on a Protonix drip, admitted to telemetry. Would recommend calling GI immediately if she were to have ongoing episodes of GI bleed or become hemodynamically stable. Meanwhile will check H&H every 4 hours, transfuse for hemoglobin less than 8. 2. Hypertension. Systolic blood pressures in the 130s in the emergency room. Will continue home regimen with metoprolol, please hold parameters. 3. History of aortic stenosis status post aortic valve replacement with porcine valve. Unfortunately patient not on anticoagulation. From a cardiac standpoint she appears to be stable, without evidence of congestive heart failure. 4. Syncope. Likely secondary to hypovolemia from GI bleed. Providing IV fluid resuscitation, plan to transfuse with blood if her hemoglobin drops below 8.0.  5. DVT prophylaxis. SCDs    Code Status: Full code Family Communication: Discussed plan with daughter present at bedside Disposition Plan: I anticipate patient will require greater than 2 night hospitalization  Time spent: 21  Jeralyn Bennett [facility] Pager [phone]  If 7PM-7AM, please contact night-coverage [email or web address] Password  [facility] 01/17/2013, 6:40 PM

## 2013-01-17 NOTE — H&P (Signed)
[facility] History and Physical  [name] YQM:[id number] DOB: [birth year] DOA: 01/17/2013  Referring physician:  PCP: No primary provider on file.  Specialists:   Chief Complaint: Melena/Hematemesis  HPI: [name] is a 79 y.o. female  with a past medical history of hypertension, dyslipidemia, recently underwent you aortic valve replacement [facility] with porcine valve, who presents emergency department with complaints of hematemesis and melena. She states feeling weak, dizzy, having poor tolerance to physical exertion yesterday. Was unable to attend church. Then this morning she had several episodes of hematemesis, characterizes coffee-ground emesis. Patient also had episode of melena. She had a syncopal event, was unconscious for approximately 10 seconds after which her son him he called EMS and was brought to the emergency department. On arrival she was hemodynamically stable, with lab work showing a hemoglobin of 8.5 and INR of 1.2. She presently denies chest pain, shortness of breath, dizziness, lightheadedness, and has not had witnessed episodes of GI bleed while in the emergency room. I discussed case with lab our gastroenterology, agreed with starting protonic strip and discontinuing aspirin therapy. Patient is not on anticoagulants. She denies taking any other NSAIDs other than daily aspirin. Denies taking BC powders and Goody powders.   Review of Systems: The patient denies anorexia, fever, weight loss,, vision loss, decreased hearing, hoarseness, chest pain, dyspnea on exertion, peripheral edema, balance deficits, hemoptysis, severe indigestion/heartburn, hematuria, incontinence, genital sores, muscle weakness, suspicious skin lesions, transient blindness, difficulty walking, depression, unusual weight change, enlarged lymph nodes, angioedema, and breast masses.    Past Medical History  Diagnosis Date  . Coronary artery disease   . Hypertension    History  reviewed. No pertinent past surgical history. Social History:  reports that she has never smoked. She does not have any smokeless tobacco history on file. She reports that she does not drink alcohol or use illicit drugs.   Allergies  Allergen Reactions  . Penicillins Rash    History reviewed. No pertinent family history.   Prior to Admission medications   Medication Sig Start Date End Date Taking? Authorizing Provider  acetaminophen (TYLENOL) 325 MG tablet Take 325 mg by mouth daily as needed (pain).   Yes Historical Provider, MD  aspirin EC 325 MG tablet Take 325 mg by mouth daily.   Yes Historical Provider, MD  calcium carbonate (TUMS - DOSED IN MG ELEMENTAL CALCIUM) 500 MG chewable tablet Chew 1 tablet by mouth daily as needed for indigestion or heartburn.   Yes Historical Provider, MD  fenofibrate micronized (LOFIBRA) 200 MG capsule Take 200 mg by mouth daily.  12/08/12  Yes Historical Provider, MD  metoprolol (LOPRESSOR) 100 MG tablet Take 50 mg by mouth daily.  11/26/12  Yes Historical Provider, MD  pravastatin (PRAVACHOL) 20 MG tablet Take 20 mg by mouth at bedtime.  12/22/12  Yes Historical Provider, MD  Vitamin D, Ergocalciferol, (DRISDOL) 50000 UNITS CAPS capsule Take 50,000 Units by mouth every 7 (seven) days. Saturdays   Yes Historical Provider, MD   Physical Exam: Filed Vitals:   01/17/13 1815  BP: 119/56  Pulse: 83  Temp:   Resp: 17    General: Pale appearing, no acute distress awake alert and oriented x3  Eyes: Pupils are equal round reactive to light extraocular movements are  Neck: Supple symmetrical no jugular venous distention or carotid bruits  Cardiovascular: Regular rate and rhythm normal S1-S2 no extremity edema  Respiratory: Lungs are clear to auscultation bilaterally no wheezing rhonchi or rales  Abdomen:  Patient having a benign abdominal examination, positive bowel sounds, no guarding or rebound tenderness  Skin: No rashes or lesions  Musculoskeletal:  Present range of motion to all extremities  \Psychiatric: Patient is awake alert and oriented x3  Neurologic: Nonfocal neurologic examination cranial nerves II through XII grossly intact no alteration to sensation global 5 of 5 muscle strength   Labs on Admission:  Basic Metabolic Panel:  Recent Labs Lab 01/17/13 1431  NA 140  K 4.5  CL 107  CO2 22  GLUCOSE 116*  BUN 35*  CREATININE 1.21*  CALCIUM 9.2   Liver Function Tests:  Recent Labs Lab 01/17/13 1431  AST 28  ALT 14  ALKPHOS 42  BILITOT 0.3  PROT 6.9  ALBUMIN 3.1*    Recent Labs Lab 01/17/13 1431  LIPASE 35   No results found for this basename: AMMONIA,  in the last 168 hours CBC:  Recent Labs Lab 01/17/13 1431  WBC 9.0  NEUTROABS 8.1*  HGB 8.5*  HCT 26.1*  MCV 87.0  PLT 376   Cardiac Enzymes: No results found for this basename: CKTOTAL, CKMB, CKMBINDEX, TROPONINI,  in the last 168 hours  BNP (last 3 results) No results found for this basename: PROBNP,  in the last 8760 hours CBG: No results found for this basename: GLUCAP,  in the last 168 hours  Radiological Exams on Admission: No results found.    Assessment/Plan Active Problems:   GI bleed   Neonatal hematemesis   Melena   H/O aortic valve replacement    1. Probable upper GI bleed. Patient presenting with episode of hematemesis as well as black tarry stools. Presents hemodynamically stable, having hemoglobin of 8.5. INR was checked in emergent apartment, back at 1.2. She is presently not on anticoagulants however had been taking daily aspirin. I discussed case with gastroenterology who will consult. Place patient on clears, make her n.p.o. after midnight, will likely undergo upper endoscopy in a.m. Will start patient on a Protonix drip, admitted to telemetry. Would recommend calling GI immediately if she were to have ongoing episodes of GI bleed or become hemodynamically stable. Meanwhile will check H&H every 4 hours, transfuse for  hemoglobin less than 8. 2. Hypertension. Systolic blood pressures in the 130s in the emergency room. Will continue home regimen with metoprolol, please hold parameters. 3. History of aortic stenosis status post aortic valve replacement with porcine valve. Unfortunately patient not on anticoagulation. From a cardiac standpoint she appears to be stable, without evidence of congestive heart failure. 4. Syncope. Likely secondary to hypovolemia from GI bleed. Providing IV fluid resuscitation, plan to transfuse with blood if her hemoglobin drops below 8.0.  5. DVT prophylaxis. SCDs  Code Status: Full code  Family Communication: Discussed plan with daughter present at bedside  Disposition Plan: I anticipate patient will require greater than 2 night hospitalization   Time spent: 24   Jeralyn Bennett  [facility]  Pager [phone]

## 2013-01-18 ENCOUNTER — Encounter (HOSPITAL_COMMUNITY)
Admission: EM | Disposition: A | Discharge status: Home / Self Care | Payer: Self-pay | Source: Home / Self Care | Attending: Internal Medicine

## 2013-01-18 ENCOUNTER — Encounter (HOSPITAL_COMMUNITY): Payer: Self-pay | Admitting: Physician Assistant

## 2013-01-18 DIAGNOSIS — K260 Acute duodenal ulcer with hemorrhage: Secondary | ICD-10-CM

## 2013-01-18 DIAGNOSIS — K250 Acute gastric ulcer with hemorrhage: Secondary | ICD-10-CM

## 2013-01-18 DIAGNOSIS — K920 Hematemesis: Secondary | ICD-10-CM | POA: Diagnosis present

## 2013-01-18 HISTORY — DX: Acute gastric ulcer with hemorrhage: K25.0

## 2013-01-18 HISTORY — DX: Acute duodenal ulcer with hemorrhage: K26.0

## 2013-01-18 HISTORY — PX: ESOPHAGOGASTRODUODENOSCOPY: SHX5428

## 2013-01-18 LAB — CBC
HCT: 20.7 % — ABNORMAL LOW (ref 36.0–46.0)
Hemoglobin: 6.9 g/dL — CL (ref 12.0–15.0)
MCH: 28.8 pg (ref 26.0–34.0)
MCHC: 33.3 g/dL (ref 30.0–36.0)
MCV: 86.3 fL (ref 78.0–100.0)
Platelets: 260 10*3/uL (ref 150–400)
RBC: 2.4 MIL/uL — ABNORMAL LOW (ref 3.87–5.11)
RDW: 15 % (ref 11.5–15.5)
WBC: 6.8 10*3/uL (ref 4.0–10.5)

## 2013-01-18 LAB — HEMOGLOBIN AND HEMATOCRIT, BLOOD
HCT: 25.7 % — ABNORMAL LOW (ref 36.0–46.0)
Hemoglobin: 8.7 g/dL — ABNORMAL LOW (ref 12.0–15.0)

## 2013-01-18 LAB — BASIC METABOLIC PANEL
BUN: 28 mg/dL — ABNORMAL HIGH (ref 6–23)
CO2: 23 mEq/L (ref 19–32)
Calcium: 8.5 mg/dL (ref 8.4–10.5)
Chloride: 109 mEq/L (ref 96–112)
Creatinine, Ser: 1.24 mg/dL — ABNORMAL HIGH (ref 0.50–1.10)
GFR calc Af Amer: 46 mL/min — ABNORMAL LOW (ref >90)
GFR calc non Af Amer: 39 mL/min — ABNORMAL LOW (ref >90)
Glucose, Bld: 86 mg/dL (ref 70–99)
Potassium: 3.9 mEq/L (ref 3.5–5.1)
Sodium: 140 mEq/L (ref 135–145)

## 2013-01-18 LAB — CLOTEST (H. PYLORI), BIOPSY: Helicobacter screen: POSITIVE — AB

## 2013-01-18 LAB — PREPARE RBC (CROSSMATCH)

## 2013-01-18 SURGERY — EGD (ESOPHAGOGASTRODUODENOSCOPY)
Anesthesia: Moderate Sedation

## 2013-01-18 MED ORDER — MIDAZOLAM HCL 5 MG/ML IJ SOLN
INTRAMUSCULAR | Status: AC
Start: 2013-01-18 — End: 2013-01-18
  Filled 2013-01-18: qty 2

## 2013-01-18 MED ORDER — FENTANYL CITRATE 0.05 MG/ML IJ SOLN
INTRAMUSCULAR | Status: AC
  Filled 2013-01-18: qty 2

## 2013-01-18 MED ORDER — SODIUM CHLORIDE 0.9 % IV SOLN: INTRAVENOUS | Status: AC

## 2013-01-18 MED ORDER — PANTOPRAZOLE SODIUM 40 MG PO TBEC
40.0000 mg | DELAYED_RELEASE_TABLET | Freq: Two times a day (BID) | ORAL | Status: DC
  Administered 2013-01-19: 40 mg via ORAL
  Filled 2013-01-18: qty 1

## 2013-01-18 MED ORDER — BUTAMBEN-TETRACAINE-BENZOCAINE 2-2-14 % EX AERO
INHALATION_SPRAY | CUTANEOUS | Status: DC | PRN
  Administered 2013-01-18: 2 via TOPICAL

## 2013-01-18 MED ORDER — DIPHENHYDRAMINE HCL 50 MG/ML IJ SOLN
INTRAMUSCULAR | Status: AC
  Filled 2013-01-18: qty 1

## 2013-01-18 MED ORDER — MIDAZOLAM HCL 10 MG/2ML IJ SOLN
INTRAMUSCULAR | Status: DC | PRN
  Administered 2013-01-18 (×2): 2 mg via INTRAVENOUS

## 2013-01-18 MED ORDER — FENTANYL CITRATE 0.05 MG/ML IJ SOLN
INTRAMUSCULAR | Status: DC | PRN
  Administered 2013-01-18 (×2): 25 ug via INTRAVENOUS

## 2013-01-18 NOTE — Progress Notes (Signed)
UR Completed [name] Duva Graves-Bigelow, RN,BSN [phone]  

## 2013-01-18 NOTE — Progress Notes (Signed)
[facility]                                                                                Patient Demographics  [name], is a 79 y.o. female, DOB - [birth year], YNW:[id number]  Admit date - 01/17/2013   Admitting Physician Jeralyn Bennett, MD  Outpatient Primary MD for the patient is No primary provider on file.  LOS - 1   Chief Complaint  Patient presents with  . GI Bleeding        Assessment & Plan    1.UGI Bleed - causing blood loss related anemia most likely secondary to gastritis from aspirin, she will be getting one unit of packed RBC on 01/18/2013, continue IV PPI, GI following day for EGD later today. No further signs of ongoing bleed.     2. Recent porcine aortic valve replacement at [facility]. Stable. For now cannot continue aspirin due to #1 above.     3. Hypertension. Continue on home dose beta blocker. Blood pressure stable.     4. Syncope prior to admission after hematemesis, due to acute upper GI bleed. Hematology stable now. Monitor. One unit packed RBC today. H&H monitor closely posttransfusion.       Code Status: Full  Family Communication:    Disposition Plan: Home   Procedures EGD due 01-18-13   Consults GI   Medications  Scheduled Meds: . metoprolol  50 mg Oral Daily  . [START ON 01/21/2013] pantoprazole (PROTONIX) IV  40 mg Intravenous Q12H  . simvastatin  10 mg Oral q1800  . sodium chloride  3 mL Intravenous Q12H   Continuous Infusions: . sodium chloride 75 mL/hr at 01/17/13 2003  . pantoprozole (PROTONIX) infusion 8 mg/hr (01/18/13 0705)   PRN Meds:.acetaminophen, ondansetron (ZOFRAN) IV, ondansetron, oxyCODONE  DVT Prophylaxis  SCDs    Lab Results  Component Value Date   PLT 260 01/18/2013    Antibiotics    Anti-infectives   None          Subjective:   [name] today has, No headache, No chest pain, No abdominal pain - No Nausea, No new weakness tingling or numbness, No Cough - SOB.  No further hematemesis.  Objective:   Filed Vitals:   01/17/13 1815 01/17/13 1948 01/18/13 0428 01/18/13 0837  BP: 119/56 142/59 126/49 132/41  Pulse: 83 86 92 94  Temp:  98.7 F (37.1 C) 98.1 F (36.7 C) 98.1 F (36.7 C)  TempSrc:   Oral Oral  Resp: 17 20 20 20   Height:  5\' 5"  (1.651 m)    Weight:  68.266 kg (150 lb 8 oz)    SpO2: 100% 100% 100% 100%    Wt Readings from Last 3 Encounters:  01/17/13 68.266 kg (150 lb 8 oz)    No intake or output data in the 24 hours ending 01/18/13 0852  Exam Awake Alert, Oriented X 3, No new F.N deficits, Normal affect [place].AT,PERRAL Supple Neck,No JVD, No cervical lymphadenopathy appriciated.  Symmetrical Chest wall movement, Good air movement bilaterally, CTAB RRR,No Gallops,Rubs or new Murmurs, No Parasternal Heave +ve B.Sounds, Abd Soft, Non tender, No organomegaly appriciated, No rebound - guarding or rigidity. No Cyanosis,  Clubbing or edema, No new Rash or bruise      Data Review   Micro Results No results found for this or any previous visit (from the past 240 hour(s)).  Radiology Reports No results found.  CBC  Recent Labs Lab 01/17/13 1431 01/18/13 0509  WBC 9.0 6.8  HGB 8.5* 6.9*  HCT 26.1* 20.7*  PLT 376 260  MCV 87.0 86.3  MCH 28.3 28.8  MCHC 32.6 33.3  RDW 14.7 15.0  LYMPHSABS 0.5*  --   MONOABS 0.3  --   EOSABS 0.0  --   BASOSABS 0.1  --     Chemistries   Recent Labs Lab 01/17/13 1431 01/18/13 0509  NA 140 140  K 4.5 3.9  CL 107 109  CO2 22 23  GLUCOSE 116* 86  BUN 35* 28*  CREATININE 1.21* 1.24*  CALCIUM 9.2 8.5  AST 28  --   ALT 14  --   ALKPHOS 42  --   BILITOT 0.3  --    ------------------------------------------------------------------------------------------------------------------ estimated creatinine clearance is 31.5 ml/min (by C-G formula based on Cr of 1.24). ------------------------------------------------------------------------------------------------------------------ No  results found for this basename: HGBA1C,  in the last 72 hours ------------------------------------------------------------------------------------------------------------------ No results found for this basename: CHOL, HDL, LDLCALC, TRIG, CHOLHDL, LDLDIRECT,  in the last 72 hours ------------------------------------------------------------------------------------------------------------------ No results found for this basename: TSH, T4TOTAL, FREET3, T3FREE, THYROIDAB,  in the last 72 hours ------------------------------------------------------------------------------------------------------------------ No results found for this basename: VITAMINB12, FOLATE, FERRITIN, TIBC, IRON, RETICCTPCT,  in the last 72 hours  Coagulation profile  Recent Labs Lab 01/17/13 1431  INR 1.24    No results found for this basename: DDIMER,  in the last 72 hours  Cardiac Enzymes No results found for this basename: CK, CKMB, TROPONINI, MYOGLOBIN,  in the last 168 hours ------------------------------------------------------------------------------------------------------------------ No components found with this basename: POCBNP,      Time Spent in minutes   35   SINGH,PRASHANT K M.D on 01/18/2013 at 8:52 AM  Between 7am to 7pm - Pager - [phone]  After 7pm go to [email or web address] - password [email or web address]  And look for the night coverage person covering for me after hours  [facility] Office  [phone]

## 2013-01-18 NOTE — Consult Note (Signed)
[facility] Consult: 10:03 AM 01/18/2013  LOS: 1 day    Referring Provider: Dr Vanessa Kayliegh Primary Care Physician:  Dr Pablo Lawrence at [facility] Primary Gastroenterologist:  Dr. Reader in [facility] (now retired) Daughter is [employer], [place] mobile #   Reason for Consultation:  Melena, dark emesis, anemia   HPI: [name] is a 79 y.o. female.  S/p 11/18/12 tissue AVR at [facility].  Stayed 8 days.  Required one unit PRBC a few days post op.  Had problems with belching, gas, diarrhea.  Tested C diff negative.  Diarrhea resolved after oral potassium discontinued.  The belching was treated with Protonix.  It has improved at home and she only takes Tums prn.  No epigastric pain.   She discharged on full dose ASA and previous cardiac, lipid lowering meds.  Had preop cardiac cath and doppler studies of LE:  There was no vascular disease detected per pt's dtr who is very well apprised of her Mom's health.   Has returned to living alone, driving and recently cleared to return to part time work.   Since discharge she has had problems with mucoid, non-bloody stools and sense of pressure in LLQ/tenesmus.  This treated by PMD with align.  She is having fewer BMs but still mucoid, non-bloody and still experiencing sense of intestinal pressure. Appetite is good, but not returned to baseline.  No nausea.  She reports 17# weight loss c/w pre AVR.  On Sunday 11/9 felt malaise and did not go to church.   Around 4 AM 11/10, Monday, awoke with nausea and proceeded to vomit a few times over 20 or so minutes.  Emesis was coffee like.  She went back to bed.  Later called her family who came over.  Nausea nor emesis has recurred and she is hungry.  Tolerated clears last night.  Around noon she passed a few melenic stools and got dizzy.  She passed out and fell:  Soft landing but has left knee abrasion.  No head trauma.  Family called EMS after talking  with PMD. No recurrence of melena either.  At ER Hgb was 8.5, but dropped to 6.9 this AM. One unit ordered and transfusing currently. Is on Protonix drip.    GI hx remarkable for colon polyps, 2 colonoscopies in her lifetime, last was ~ 5 years ago.  Had polyps on at least one of these but she does not recall details as to type of polyps or when they occurred.  GI MD is retired but pt has never received colonoscopy call back letters.   She has never had EGD and had no UGI problems or anemia in past.  No NSAIDs except the ASA.     Past Medical History  Diagnosis Date  . Hypertension   . Pneumonia   . Arthritis     left hip  . Hyperlipidemia   . Colon polyps     type not known, year(s) of occurrence not defined.     Past Surgical History  Procedure Laterality Date  . Tissue aortic valve replacement  Nov 18 2012    at [facility]  . Dilation and curettage of uterus      x 2.  . Cataract extraction Bilateral     Prior to Admission medications   Medication Sig Start Date End Date Taking? Authorizing Provider  acetaminophen (TYLENOL) 325 MG tablet Take 325 mg by mouth daily as needed (pain).   Yes Historical Provider, MD  aspirin EC 325  MG tablet Take 325 mg by mouth daily.   Yes Historical Provider, MD  calcium carbonate (TUMS - DOSED IN MG ELEMENTAL CALCIUM) 500 MG chewable tablet Chew 1 tablet by mouth daily as needed for indigestion or heartburn.   Yes Historical Provider, MD  fenofibrate micronized (LOFIBRA) 200 MG capsule Take 200 mg by mouth daily.  12/08/12  Yes Historical Provider, MD  metoprolol (LOPRESSOR) 100 MG tablet Take 50 mg by mouth daily.  11/26/12  Yes Historical Provider, MD  pravastatin (PRAVACHOL) 20 MG tablet Take 20 mg by mouth at bedtime.  12/22/12  Yes Historical Provider, MD  Vitamin D, Ergocalciferol, (DRISDOL) 50000 UNITS CAPS capsule Take 50,000 Units by mouth every 7 (seven) days. Saturdays   Yes Historical Provider, MD  Tums                                        PRN  Scheduled Meds: . metoprolol  50 mg Oral Daily  . [START ON 01/21/2013] pantoprazole (PROTONIX) IV  40 mg Intravenous Q12H  . simvastatin  10 mg Oral q1800  . sodium chloride  3 mL Intravenous Q12H   Infusions: . sodium chloride    . pantoprozole (PROTONIX) infusion 8 mg/hr (01/18/13 0705)   PRN Meds: acetaminophen, ondansetron (ZOFRAN) IV, ondansetron, oxyCODONE   Allergies as of 01/23/2013 - Review Complete 2013/01/23  Allergen Reaction Noted  . Penicillins Rash 2013-01-23    Family History Dad died at 80 after MI Mom recently passed at 17 (almost made it to 100) and had recent diagnoses of bladder cancer.  History   Social History  . Marital Status: widow    Spouse Name: N/A    Number of Children: N/A  . Years of Education: N/A   Occupational History  . Working 2 days per week Scientist, research (medical) at [employer].  Has not returned to work, though recently cleared to return by Careers adviser.    Social History Main Topics  . Smoking status: Never Smoker   . Smokeless tobacco: Not on file  . Alcohol Use: No  . Drug Use: No  . Sexual Activity: Not on file   Social History Narrative  . Lives alone     REVIEW OF SYSTEMS: Constitutional:  Weight down 17 # since early sept 2014 ENT:  No nose bleeds, + sinus congestion Pulm:  No SOB, no cough. CV:  No chest pain. No palpitations. GU:  No blood in urine, no dysuria or frequency.  GI:  Per HPI. Not eating ice Heme:  Per HPI.    Transfusions:  Per HPI Neuro:  No headache, no blurry vision Derm:  No sores or rash.  No itching Endocrine:  No sweats.  + feeling cold a lot lately.   Immunization:  Flu, pneumovax up to date.  Flu shot last week Travel:  None.    PHYSICAL EXAM: Vital signs in last 24 hours: Filed Vitals:   01/18/13 0857  BP: 138/48  Pulse: 88  Temp: 98.6 F (37 C)  Resp: 18   Wt Readings from Last 3 Encounters:  2013-01-23 68.266 kg (150 lb 8 oz)  January 23, 2013 68.266 kg (150 lb 8  oz)   General: pale elderly non-frail, non-ill looking WF.  Comfortable.  Head: symmetric, atraumatic Eyes:  No icterus, + conjunctival pallor Ears:  Slightly HOH  Nose:  No discharge, no sneezing.  Mild congestion. Mouth:  Moist, clear, good teeth.  Tongue midline Neck:  No mass, no JVD.  No goiter Lungs:  Clear bil.  No dyspnea or cough Heart:  RRR.  Muffled valve snap.  No murmur. Sternal scar healing well, intact.  Abdomen:  Soft, ND, NT.  No mass, no hernia.   Rectal: no stool, no mass, no visible blood   Musc/Skeltl: kyphosis.  Arthritic changes in fingers. Extremities:  No CCE.  2 to 3 + pedal pulses bil.   Neurologic:  Oriented x 3.  No tremor.  No limb weakness Skin:  No telangectasia or sores.  Slight abrasion on left knee Tattoos:  none Nodes:  No cervical adenopathy   Psych:  Cooperative, relaxed, appropriate.    LAB RESULTS:  Recent Labs  01/17/13 1431 01/18/13 0509  WBC 9.0 6.8  HGB 8.5* 6.9*  HCT 26.1* 20.7*  PLT 376 260   BMET Lab Results  Component Value Date   NA 140 01/18/2013   NA 140 01/17/2013   K 3.9 01/18/2013   K 4.5 01/17/2013   CL 109 01/18/2013   CL 107 01/17/2013   CO2 23 01/18/2013   CO2 22 01/17/2013   GLUCOSE 86 01/18/2013   GLUCOSE 116* 01/17/2013   BUN 28* 01/18/2013   BUN 35* 01/17/2013   CREATININE 1.24* 01/18/2013   CREATININE 1.21* 01/17/2013   CALCIUM 8.5 01/18/2013   CALCIUM 9.2 01/17/2013   LFT  Recent Labs  01/17/13 1431  PROT 6.9  ALBUMIN 3.1*  AST 28  ALT 14  ALKPHOS 42  BILITOT 0.3   PT/INR Lab Results  Component Value Date   INR 1.24 01/17/2013    ENDOSCOPIC STUDIES: Colonoscopy x 2, per HPI  IMPRESSION:   *  Upper GI bleed.  Rule out ulcer, rule out AVM.  *  ABL anemia.  Transfusing PRBC currently  *  S/p porcine AVR 11/18/12.  On 325 ASA since surgery - not on PPI as outpatient Post op A fib resolved by discharge.   *  Hx colon polyps of unknown pathology.  Last colonoscopy was ~  2010.  *  Azotemia and ARF, improved.    PLAN:     *  Needs EGD, working out time for this but suspect it will need to be late today.    Jennye Moccasin  01/18/2013, 10:03 AM Pager: [phone]    [facility] Attending  I have also seen and assessed the patient and agree with the above note. She is improving with transfusion and I will perform EGD later today. The risks and benefits as well as alternatives of endoscopic procedure(s) have been discussed and reviewed. All questions answered. The patient agrees to proceed. Daughter present for interview and exam.   Iva Boop, MD, [facility] [phone] (pager) 01/18/2013 11:38 AM

## 2013-01-18 NOTE — Care Management Note (Unsigned)
    Page 1 of 1   01/18/2013     12:07:53 PM   CARE MANAGEMENT NOTE 01/18/2013  Patient:  [name], [name]   Account Number:  [id number]  Date Initiated:  01/18/2013  Documentation initiated by:  GRAVES-BIGELOW,Jacinta Penalver  Subjective/Objective Assessment:   Pt admitted for Melena/Hematemesis. Hgb 6.9 on admission.     Action/Plan:   CM will continue to monitor for disposition needs.   Anticipated DC Date:  01/20/2013   Anticipated DC Plan:  HOME W HOME HEALTH SERVICES      DC Planning Services  CM consult      Choice offered to / List presented to:             Status of service:  In process, will continue to follow Medicare Important Message given?   (If response is "NO", the following Medicare IM given date fields will be blank) Date Medicare IM given:   Date Additional Medicare IM given:    Discharge Disposition:    Per UR Regulation:  Reviewed for med. necessity/level of care/duration of stay  If discussed at Long Length of Stay Meetings, dates discussed:    Comments:

## 2013-01-18 NOTE — Op Note (Signed)
[facility] [address] [place], [address]   ENDOSCOPY PROCEDURE REPORT  PATIENT: [name], [name]  MR#: [id number] BIRTHDATE: Jan 22, 1929 , 84  yrs. old GENDER: Female ENDOSCOPIST: Iva Boop, MD, [facility] PROCEDURE DATE:  01/18/2013 PROCEDURE:  EGD w/ biopsy ASA CLASS:     Class III INDICATIONS:  Hematemesis. MEDICATIONS: Fentanyl 50 mcg IV and Versed 4 mg IV TOPICAL ANESTHETIC: Cetacaine Spray  DESCRIPTION OF PROCEDURE: After the risks benefits and alternatives of the procedure were thoroughly explained, informed consent was obtained.  The Pentax Gastroscope [id number] endoscope was introduced through the mouth and advanced to the second portion of the duodenum. Without limitations.  The instrument was slowly withdrawn as the mucosa was fully examined.   STOMACH: Multiple small clean-based ulcers were found in the gastric antrum.  DUODENUM: A medium sized shallow and clean-based ulcer was found in the duodenal bulb.  The remainder of the upper endoscopy exam was otherwise normal. Retroflexed views revealed a hiatal hernia.     The scope was then withdrawn from the patient and the procedure completed.  COMPLICATIONS: There were no complications. ENDOSCOPIC IMPRESSION: 1.   Multiple small ulcers were found in the gastric antrum 2.   Medium sized ulcer was found in the duodenal bulb 3.   The remainder of the upper endoscopy exam was otherwise normal  RECOMMENDATIONS: 1.  PPI qam - rec: pantoprazole 40 mg daily before breakfast 2.  Await biopsy results 3.   Off ASA x 2 weeks 4.   If CLO test negative will do a serology 5.   Home tomorrow unless other problems 6.   ferrous sulfate 325 mg bid 7.   CBC early next week at PCP and fax to me and visit with me or an NP/PA in my office in 2 weeks  eSigned:  Iva Boop, MD, [facility] 01/18/2013 5:26 PM  CC:The Patient  Dr. Pablo Lawrence ([facility])

## 2013-01-18 NOTE — Progress Notes (Signed)
CRITICAL VALUE ALERT  Critical value received:  Hgb 6.9  Date of notification:  01/18/13  Time of notification:  0555  Critical value read back:yes  Nurse who received alert:  Sallee Lange, RN  MD notified (1st page):  Tama Gander, NP  Time of first page:  0603  MD notified (2nd page):  Time of second page:  Responding MD:    Time MD responded:

## 2013-01-19 ENCOUNTER — Encounter (HOSPITAL_COMMUNITY): Payer: Self-pay | Admitting: Internal Medicine

## 2013-01-19 ENCOUNTER — Inpatient Hospital Stay (HOSPITAL_COMMUNITY): Payer: Medicare Other

## 2013-01-19 ENCOUNTER — Encounter: Payer: Self-pay | Admitting: Physician Assistant

## 2013-01-19 LAB — BASIC METABOLIC PANEL
BUN: 14 mg/dL (ref 6–23)
CO2: 22 mEq/L (ref 19–32)
Calcium: 8.5 mg/dL (ref 8.4–10.5)
Chloride: 106 mEq/L (ref 96–112)
Creatinine, Ser: 1.04 mg/dL (ref 0.50–1.10)
GFR calc Af Amer: 56 mL/min — ABNORMAL LOW (ref >90)
GFR calc non Af Amer: 49 mL/min — ABNORMAL LOW (ref >90)
Glucose, Bld: 94 mg/dL (ref 70–99)
Potassium: 3.5 mEq/L (ref 3.5–5.1)
Sodium: 139 mEq/L (ref 135–145)

## 2013-01-19 LAB — CBC
HCT: 25.6 % — ABNORMAL LOW (ref 36.0–46.0)
Hemoglobin: 8.4 g/dL — ABNORMAL LOW (ref 12.0–15.0)
MCH: 28.4 pg (ref 26.0–34.0)
MCHC: 32.8 g/dL (ref 30.0–36.0)
MCV: 86.5 fL (ref 78.0–100.0)
Platelets: 268 10*3/uL (ref 150–400)
RBC: 2.96 MIL/uL — ABNORMAL LOW (ref 3.87–5.11)
RDW: 15.2 % (ref 11.5–15.5)
WBC: 8.9 10*3/uL (ref 4.0–10.5)

## 2013-01-19 LAB — TYPE AND SCREEN
ABO/RH(D): A NEG
Antibody Screen: NEGATIVE
Unit division: 0

## 2013-01-19 LAB — URINE CULTURE

## 2013-01-19 MED ORDER — PANTOPRAZOLE SODIUM 40 MG PO TBEC: 40.0000 mg | DELAYED_RELEASE_TABLET | Freq: Every day | ORAL | Status: DC

## 2013-01-19 MED ORDER — FERROUS SULFATE 325 (65 FE) MG PO TABS: 325.0000 mg | ORAL_TABLET | Freq: Every day | ORAL | Status: DC

## 2013-01-19 MED ORDER — DEXTROSE 5 % IV SOLN
1.0000 g | INTRAVENOUS | Status: DC
  Administered 2013-01-19: 1 g via INTRAVENOUS
  Filled 2013-01-19: qty 10

## 2013-01-19 MED ORDER — AZITHROMYCIN 250 MG PO TABS: ORAL_TABLET | ORAL | Status: DC

## 2013-01-19 MED ORDER — LORATADINE 10 MG PO TABS: 10.0000 mg | ORAL_TABLET | Freq: Every day | ORAL | Status: DC | PRN

## 2013-01-19 MED ORDER — GUAIFENESIN ER 600 MG PO TB12: 600.0000 mg | ORAL_TABLET | Freq: Two times a day (BID) | ORAL | Status: DC

## 2013-01-19 NOTE — Discharge Summary (Signed)
Physician Discharge Summary  [name] ZOX:[id number] DOB: 02-23-1929 DOA: 01/17/2013  PCP: Pablo Lawrence  Admit date: 01/17/2013 Discharge date: 01/19/2013  Recommendations for Outpatient Follow-up:  1. Start PPI daily 2. Follow up biopsy results from EGD  3. Stop ASA for 2 weeks  4. Follow up CLO test results 5. Home today 6. ferrous sulfate 325 mg bid  7. CBC early next week at PCP and fax to Dr. Leone Payor and follow up with GI in 2 weeks  Discharge Diagnoses:  Active Problems:   GI bleed   Melena   H/O aortic valve replacement   Hematemesis   Acute duodenal ulcer with hemorrhage   Acute gastric ulcers with hemorrhage   Discharge Condition: stable   Diet recommendation: resume previous heart healthy  Filed Weights   01/17/13 1525 01/17/13 1948  Weight: 115 lb (52.164 kg) 150 lb 8 oz (68.266 kg)    History of present illness:  [name] is a 79 y.o. female with a past medical history of hypertension, dyslipidemia, recently underwent you aortic valve replacement [facility] with porcine valve, who presents emergency department with complaints of hematemesis and melena. She states feeling weak, dizzy, having poor tolerance to physical exertion yesterday. Was unable to attend church. Then this morning she had several episodes of hematemesis, characterizes coffee-ground emesis. Patient also had episode of melena. She had a syncopal event, was unconscious for approximately 10 seconds after which her son him he called EMS and was brought to the emergency department. On arrival she was hemodynamically stable, with lab work showing a hemoglobin of 8.5 and INR of 1.2. She presently denies chest pain, shortness of breath, dizziness, lightheadedness, and has not had witnessed episodes of GI bleed while in the emergency room. I discussed case with lab our gastroenterology, agreed with starting protonic strip and discontinuing aspirin therapy. Patient is not on anticoagulants.  She denies taking any other NSAIDs other than daily aspirin. Denies taking BC powders and Goody powders.   Hospital Course:  1.UGI Bleed - causing blood loss related anemia most likely secondary to gastritis from aspirin, she will be getting one unit of packed RBC on 01/18/2013, continue IV PPI, GI following day for EGD revealed multiple small ulcers. 2. Recent porcine aortic valve replacement at [facility]. Stable. For now cannot continue aspirin due to #1 above.  3. Hypertension. Continue on home dose beta blocker. Blood pressure stable.  4. Syncope prior to admission after hematemesis, due to acute upper GI bleed. Hematology stable now. Monitor. One unit packed RBC today. H&H monitor closely posttransfusion.  5. Allergic Rhinitis/ Sinusitis - home with z pack and loratadine prn.  6. Anemia - FeSO4 325 mg po daily  Code Status: Full  Family Communication:  Disposition Plan: Home    Procedures: ENDOSCOPIC IMPRESSION:  1. Multiple small ulcers were found in the gastric antrum  2. Medium sized ulcer was found in the duodenal bulb  3. The remainder of the upper endoscopy exam was otherwise normal  Consultations:  GI [facility])  Discharge Exam: Pt reports that she feels fine, she is asking to go home. She is reporting some sinus pressure and drainage and sneezing on occasion.  No SOB.  Filed Vitals:   01/19/13 1346  BP: 117/39  Pulse:   Temp:   Resp:     General: awake, alert, no distress ENT: nasal congestion, postnasal drainage seen, swollen nasal turbinates Cardiovascular: normal s1, s2 sounds Respiratory: BBS clear   Discharge Instructions  Discharge Orders  Future Appointments Provider Department Dept Phone   02/02/2013 10:00 AM Amy Oswald Hillock, PA-C [facility] [phone]   Future Orders Complete By Expires   Diet - low sodium heart healthy  As directed    Discharge instructions  As directed    Comments:     STOP TAKING ASPIRIN FOR  2 WEEKS HAVE CBC CHECKED IN 1 WEEK AND FAX COPY TO GASTROENTEROLOGIST RETURN IF SYMPTOMS RECUR, WORSEN OR NEW PROBLEMS DEVELOP   Discontinue IV  As directed    Increase activity slowly  As directed        Medication List    STOP taking these medications       aspirin EC 325 MG tablet     calcium carbonate 500 MG chewable tablet  Commonly known as:  TUMS - dosed in mg elemental calcium      TAKE these medications       acetaminophen 325 MG tablet  Commonly known as:  TYLENOL  Take 325 mg by mouth daily as needed (pain).     azithromycin 250 MG tablet  Commonly known as:  ZITHROMAX  Take 2 tabs PO x 1 dose, then 1 tab PO QD x 4 days     fenofibrate micronized 200 MG capsule  Commonly known as:  LOFIBRA  Take 200 mg by mouth daily.     ferrous sulfate 325 (65 FE) MG tablet  Take 1 tablet (325 mg total) by mouth daily with breakfast.     guaiFENesin 600 MG 12 hr tablet  Commonly known as:  MUCINEX  Take 1 tablet (600 mg total) by mouth 2 (two) times daily.     loratadine 10 MG tablet  Commonly known as:  CLARITIN  Take 1 tablet (10 mg total) by mouth daily as needed for allergies or rhinitis.     metoprolol 100 MG tablet  Commonly known as:  LOPRESSOR  Take 50 mg by mouth daily.     pantoprazole 40 MG tablet  Commonly known as:  PROTONIX  Take 1 tablet (40 mg total) by mouth daily.  Start taking on:  01/20/2013     pravastatin 20 MG tablet  Commonly known as:  PRAVACHOL  Take 20 mg by mouth at bedtime.     Vitamin D (Ergocalciferol) 50000 UNITS Caps capsule  Commonly known as:  DRISDOL  Take 50,000 Units by mouth every 7 (seven) days. Saturdays       Allergies  Allergen Reactions  . Penicillins Rash       Follow-up Information   Follow up with Mike Gip, PA-C On 02/02/2013. (at 10 AM with PA for Dr Leone Payor)    Specialty:  Gastroenterology   Contact information:   [address] [place] [address] [phone]       Follow up with  Anderson Malta, MD On 01/26/2013. (have MD check a CBC and fax results to Dr Leone Payor at [phone])    Specialty:  Family Medicine       The results of significant diagnostics from this hospitalization (including imaging, microbiology, ancillary and laboratory) are listed below for reference.    Significant Diagnostic Studies: Dg Chest 2 View  01/19/2013   CLINICAL DATA:  Fever and congestion.  EXAM: CHEST  2 VIEW  COMPARISON:  None.  FINDINGS: Aortic valve replacement. Heart size upper normal. Negative for heart failure. Negative for infiltrate effusion or mass. Probable chronic lung disease.  IMPRESSION: No active cardiopulmonary disease.   Electronically Signed  By: Marlan Palau M.D.   On: 01/19/2013 14:07    Microbiology: Recent Results (from the past 240 hour(s))  URINE CULTURE     Status: None   Collection Time    01/17/13  6:42 PM      Result Value Range Status   Specimen Description URINE, CLEAN CATCH   Final   Special Requests NONE   Final   Culture  Setup Time     Final   Value: 01/18/2013 01:12     Performed at [facility]   Culture     Final   Value: Culture reincubated for better growth     Performed at [facility]   Report Status PENDING   Incomplete     Labs: Basic Metabolic Panel:  Recent Labs Lab 01/17/13 1431 01/18/13 0509 01/19/13 0529  NA 140 140 139  K 4.5 3.9 3.5  CL 107 109 106  CO2 22 23 22   GLUCOSE 116* 86 94  BUN 35* 28* 14  CREATININE 1.21* 1.24* 1.04  CALCIUM 9.2 8.5 8.5   Liver Function Tests:  Recent Labs Lab 01/17/13 1431  AST 28  ALT 14  ALKPHOS 42  BILITOT 0.3  PROT 6.9  ALBUMIN 3.1*    Recent Labs Lab 01/17/13 1431  LIPASE 35   No results found for this basename: AMMONIA,  in the last 168 hours CBC:  Recent Labs Lab 01/17/13 1431 01/18/13 0509 01/18/13 1800 01/19/13 0529  WBC 9.0 6.8  --  8.9  NEUTROABS 8.1*  --   --   --   HGB 8.5* 6.9* 8.7* 8.4*  HCT 26.1* 20.7* 25.7* 25.6*  MCV  87.0 86.3  --  86.5  PLT 376 260  --  268   Cardiac Enzymes: No results found for this basename: CKTOTAL, CKMB, CKMBINDEX, TROPONINI,  in the last 168 hours BNP: BNP (last 3 results) No results found for this basename: PROBNP,  in the last 8760 hours CBG: No results found for this basename: GLUCAP,  in the last 168 hours   Signed:  Evangelia Whitaker  [facility] 01/19/2013, 2:36 PM

## 2013-01-19 NOTE — Progress Notes (Signed)
D/c orders received, IV removed with gauze on, pt remains in stable condition,pt meds and instructions reviewed and given to pt; reviewed s/s of GI bleed,  pt d/c to home

## 2013-01-19 NOTE — Progress Notes (Signed)
     [facility] Daily Rounding Note 01/19/2013, 8:22 AM  LOS: 2 days   SUBJECTIVE:       Feels well. Stool still dark.  No nausea, no weakness.  Eating ok. Is c/o sinus congestion  OBJECTIVE:         Vital signs in last 24 hours:    Temp:  [98.1 F (36.7 C)-101 F (38.3 C)] 98.9 F (37.2 C) (11/12 0741) Pulse Rate:  [64-99] 99 (11/12 0352) Resp:  [15-25] 20 (11/12 0352) BP: (122-180)/(35-85) 127/85 mmHg (11/12 0352) SpO2:  [96 %-100 %] 100 % (11/12 0352) Last BM Date: 01/19/13 General: slightly pale, comfortable .  Not ill looking  Heart: RRR Chest: clear bil.  No dyspnea Abdomen: soft, ND, NT, no mass or HSM  Extremities: no CCE Neuro/Psych:  Pleasant, cooperative.  Not confused.   Intake/Output from previous day:    Intake/Output this shift:    Lab Results:  Recent Labs  01/17/13 1431 01/18/13 0509 01/18/13 1800 01/19/13 0529  WBC 9.0 6.8  --  8.9  HGB 8.5* 6.9* 8.7* 8.4*  HCT 26.1* 20.7* 25.7* 25.6*  PLT 376 260  --  268   BMET  Recent Labs  01/17/13 1431 01/18/13 0509 01/19/13 0529  NA 140 140 139  K 4.5 3.9 3.5  CL 107 109 106  CO2 22 23 22   GLUCOSE 116* 86 94  BUN 35* 28* 14  CREATININE 1.21* 1.24* 1.04  CALCIUM 9.2 8.5 8.5   LFT  Recent Labs  01/17/13 1431  PROT 6.9  ALBUMIN 3.1*  AST 28  ALT 14  ALKPHOS 42  BILITOT 0.3   PT/INR  Recent Labs  01/17/13 1431  LABPROT 15.3*  INR 1.24     ASSESMENT:   *  GI bleed.   EGD 01/19/2013: ulcers in gastric antrum and duodenal bulb.  No hemostatic therapy required   *  ABL anemia.  S/p PRBCs x one.  FeSO4 325 bid started 01/18/13.  hgb stable. *  ARF, resolved.       PLAN   *  Once daily PPI.  *  CBC early next week at PCP and fax to me.  Has ROV with GI on 11/26 at 10 AM. Family has ROV with her PMD for 11/19.  GI fax # given so PMD can send CBC reading.  *  Hold ASA for 2 weeks    Jennye Moccasin  01/19/2013, 8:22 AM Pager: [phone] Agree with Ms. Heron Nay assessment  and plan. Iva Boop, MD, [facility]

## 2013-01-20 NOTE — ED Provider Notes (Signed)
Medical screening examination/treatment/procedure(s) were performed by non-physician practitioner and as supervising physician I was immediately available for consultation/collaboration.  EKG Interpretation     Ventricular Rate:  80 PR Interval:  206 QRS Duration: 140 QT Interval:  422 QTC Calculation: 487 R Axis:   -27 Text Interpretation:  Age not entered, assumed to be  79 years old for purpose of ECG interpretation Sinus rhythm Borderline prolonged PR interval Probable left atrial enlargement Left bundle branch block ED PHYSICIAN INTERPRETATION AVAILABLE IN [facility]             Amye Grego R. Rubin Payor, MD 01/20/13 [phone]

## 2013-01-25 LAB — CULTURE, BLOOD (ROUTINE X 2)
Culture: NO GROWTH
Culture: NO GROWTH

## 2013-02-02 ENCOUNTER — Encounter: Payer: Self-pay | Admitting: Physician Assistant

## 2013-02-02 ENCOUNTER — Ambulatory Visit (INDEPENDENT_AMBULATORY_CARE_PROVIDER_SITE_OTHER): Payer: Medicare Other | Admitting: Physician Assistant

## 2013-02-02 VITALS — BP 118/42 | HR 76 | Ht 65.0 in | Wt 153.4 lb

## 2013-02-02 DIAGNOSIS — K269 Duodenal ulcer, unspecified as acute or chronic, without hemorrhage or perforation: Secondary | ICD-10-CM

## 2013-02-02 DIAGNOSIS — D62 Acute posthemorrhagic anemia: Secondary | ICD-10-CM

## 2013-02-02 DIAGNOSIS — Z8719 Personal history of other diseases of the digestive system: Secondary | ICD-10-CM

## 2013-02-02 NOTE — Progress Notes (Signed)
Subjective:    Patient ID: [name], female    DOB: 07-07-1928, 79 y.o.   MRN: [id number]  HPI [name]  is a pleasant 79 year old white female known to Dr. Leone Payor from recent hospitalization. She was hospitalized 1110 through 01/19/2013 with an acute GI bleed. She had upper endoscopy on 01/18/2013 per Dr. Leone Payor with finding of multiple small antral ulcers and a medium-sized clean base ulcer in the duodenal bulb. Her hemoglobin dropped to 6.9 and she did require 2 units of packed rbc's during her hospitalization. On discharge her hemoglobin was 8.4 hematocrit of 25.6. She did have CLOtest in done at the time of the EGD in this was pending at the time of her discharge. Patient had been on a 325 mg aspirin daily since undergoing an aortic valve replacement with a porcine valve at [facility] in September of 2014. She has not been not any aspirin since discharge. Patient comes in today for followup. She says she has gradually gaining her strength back but still feels weak. She says she has been through a lot over the past few months and noticed that we'll take time. She will has not noticed any melena or hematochezia. She has been needing and says her appetite is improving. She has no complaints of abdominal pain or nausea. There has been no melena or hematochezia though she says her stools are dark on iron. She's been taking Protonix 40 mg by mouth every morning She did see her primary care physician at [facility]  On 11/20 and has labs with her today showing a hemoglobin of 9 hematocrit of 35 .     Review of Systems  Constitutional: Positive for fatigue.  HENT: Negative.   Eyes: Negative.   Cardiovascular: Negative.   Gastrointestinal: Negative.   Endocrine: Negative.   Genitourinary: Negative.   Musculoskeletal: Negative.   Skin: Negative.   Allergic/Immunologic: Negative.   Neurological: Negative.   Hematological: Negative.   Psychiatric/Behavioral: Negative.    Outpatient  Encounter Prescriptions as of 02/02/2013  Medication Sig  . acetaminophen (TYLENOL) 325 MG tablet Take 325 mg by mouth daily as needed (pain).  . fenofibrate micronized (LOFIBRA) 200 MG capsule Take 200 mg by mouth daily.   . ferrous sulfate 325 (65 FE) MG tablet Take 1 tablet (325 mg total) by mouth daily with breakfast.  . guaiFENesin (MUCINEX) 600 MG 12 hr tablet Take 1 tablet (600 mg total) by mouth 2 (two) times daily.  Marland Kitchen loratadine (CLARITIN) 10 MG tablet Take 1 tablet (10 mg total) by mouth daily as needed for allergies or rhinitis.  . metoprolol (LOPRESSOR) 100 MG tablet Take 50 mg by mouth daily.   . pantoprazole (PROTONIX) 40 MG tablet Take 1 tablet (40 mg total) by mouth daily.  . pravastatin (PRAVACHOL) 20 MG tablet Take 20 mg by mouth at bedtime.   . Vitamin D, Ergocalciferol, (DRISDOL) 50000 UNITS CAPS capsule Take 50,000 Units by mouth every 7 (seven) days. Saturdays  . [DISCONTINUED] azithromycin (ZITHROMAX) 250 MG tablet Take 2 tabs PO x 1 dose, then 1 tab PO QD x 4 days    Allergies  Allergen Reactions  . Penicillins Rash   Patient Active Problem List   Diagnosis Date Noted  . Hematemesis 01/18/2013  . Acute duodenal ulcer with hemorrhage 01/18/2013  . Acute gastric ulcers with hemorrhage 01/18/2013  . GI bleed 01/17/2013  . H/O aortic valve replacement 01/17/2013   History  Substance Use Topics  . Smoking status: Never Smoker   .  Smokeless tobacco: Not on file  . Alcohol Use: No   family history is negative for Colon cancer, Breast cancer, Throat cancer, and Stomach cancer.     Objective:   Physical Exam   well-developed elderly white female in no  acute distress ;accompanied by her  Granddaughter. Blood pressure 118/42 pulse 76 height 5 foot 5 weight 153. HEENT; nontraumatic normocephalic EOMI PERRLA sclera anicteric conjunctiva are somewhat pale, Neck ;supple no JVD, Cardiovascular; regular rate and rhythm with S1-S2 no murmur or gallop she does have a sternal  incisional scar, Pulmonary; clear bilaterally, Abdomen; soft nontender nondistended bowel sounds are active no palpable mass or hepatosplenomegaly, Extremities; no clubbing cyanosis or edema skin warm and dry, Psych; mood and affect normal and appropriate        Assessment & Plan:  # 21  79 year old female status post acute GI bleed 01/17/2013 secondary to several small antral ulcers and 1 medium-sized clean-based duodenal ulcer- in the setting of aspirin use. CLOtest has also returned positive Patient doing well post discharge from the hospital #2 anemia secondary to acute blood loss #3 fatigue secondary to above #4 status post aortic valve replacement with porcine valve September 2014  Plan; patient will continue Protonix 40 mg by mouth every morning and as she will be resuming aspirin advised may want to stay on Protonix indefinitely Okay to restart a baby aspirin next week Will treat H. pylori with Pylera-patient was given a sample packed today to cover the entire 10 day course. She is advised to increase the Protonix 40 mg by mouth twice daily for 10 days while taking the antibiotics She may stop oral iron after another 2-3 weeks She scheduled to have followup CBC in 2 weeks with her primary care physician. Followup with Dr. Leone Payor will be as needed

## 2013-02-02 NOTE — Patient Instructions (Addendum)
Take oral iron for 2 more weeks then ok to stop.  May restart a baby aspirin next week.  Plan to stay on Protonix 40mg  daily long term.  Take Pylera as instructed for 10 days.  While you are on the Pylera take Protonix twice daily for those 10 days.  Call us for any problems!  Follow up with dr. Leone Payor as needed .

## 2013-02-07 NOTE — Progress Notes (Signed)
Agree with Ms. Oswald Hillock assessment and plan. Iva Boop, MD, [facility]  I also recommend that she be followed by primary to care to make sure anemia resolves need iron longer than 2-3 weeks more) and that she be checked for eradication of H. Pylori after she completes Pylera and can hold PPI x 2 weeks - may do with H. Pylori stool antigen.  She may return to me for that if needed but suspect PCP can do.

## 2013-10-25 ENCOUNTER — Other Ambulatory Visit (HOSPITAL_COMMUNITY): Payer: Self-pay | Admitting: Orthopaedic Surgery

## 2013-12-12 ENCOUNTER — Encounter (HOSPITAL_COMMUNITY): Payer: Self-pay | Admitting: Pharmacy Technician

## 2013-12-14 ENCOUNTER — Encounter (HOSPITAL_COMMUNITY): Payer: Self-pay

## 2013-12-14 ENCOUNTER — Encounter (HOSPITAL_COMMUNITY)
Admission: RE | Admit: 2013-12-14 | Discharge: 2013-12-14 | Disposition: A | Discharge status: Home / Self Care | Payer: Medicare Other | Source: Ambulatory Visit | Attending: Orthopaedic Surgery | Admitting: Orthopaedic Surgery

## 2013-12-14 ENCOUNTER — Encounter (INDEPENDENT_AMBULATORY_CARE_PROVIDER_SITE_OTHER): Payer: Self-pay

## 2013-12-14 DIAGNOSIS — K260 Acute duodenal ulcer with hemorrhage: Secondary | ICD-10-CM | POA: Diagnosis not present

## 2013-12-14 DIAGNOSIS — M1612 Unilateral primary osteoarthritis, left hip: Secondary | ICD-10-CM | POA: Insufficient documentation

## 2013-12-14 DIAGNOSIS — K250 Acute gastric ulcer with hemorrhage: Secondary | ICD-10-CM | POA: Insufficient documentation

## 2013-12-14 DIAGNOSIS — Z954 Presence of other heart-valve replacement: Secondary | ICD-10-CM | POA: Insufficient documentation

## 2013-12-14 DIAGNOSIS — Z0000 Encounter for general adult medical examination without abnormal findings: Secondary | ICD-10-CM | POA: Insufficient documentation

## 2013-12-14 DIAGNOSIS — K920 Hematemesis: Secondary | ICD-10-CM | POA: Insufficient documentation

## 2013-12-14 HISTORY — DX: Presence of prosthetic heart valve: Z95.2

## 2013-12-14 HISTORY — DX: Anemia, unspecified: D64.9

## 2013-12-14 HISTORY — DX: Nocturia: R35.1

## 2013-12-14 HISTORY — DX: Personal history of other medical treatment: Z92.89

## 2013-12-14 LAB — URINALYSIS, ROUTINE W REFLEX MICROSCOPIC
Glucose, UA: NEGATIVE mg/dL
Hgb urine dipstick: NEGATIVE
Ketones, ur: NEGATIVE mg/dL
Nitrite: NEGATIVE
Protein, ur: NEGATIVE mg/dL
Specific Gravity, Urine: 1.026 (ref 1.005–1.030)
Urobilinogen, UA: 0.2 mg/dL (ref 0.0–1.0)
pH: 5 (ref 5.0–8.0)

## 2013-12-14 LAB — BASIC METABOLIC PANEL
Anion gap: 14 (ref 5–15)
BUN: 32 mg/dL — ABNORMAL HIGH (ref 6–23)
CO2: 24 mEq/L (ref 19–32)
Calcium: 9.8 mg/dL (ref 8.4–10.5)
Chloride: 99 mEq/L (ref 96–112)
Creatinine, Ser: 1.19 mg/dL — ABNORMAL HIGH (ref 0.50–1.10)
GFR calc Af Amer: 48 mL/min — ABNORMAL LOW (ref >90)
GFR calc non Af Amer: 41 mL/min — ABNORMAL LOW (ref >90)
Glucose, Bld: 81 mg/dL (ref 70–99)
Potassium: 4.5 mEq/L (ref 3.7–5.3)
Sodium: 137 mEq/L (ref 137–147)

## 2013-12-14 LAB — URINE MICROSCOPIC-ADD ON

## 2013-12-14 LAB — ABO/RH: ABO/RH(D): A NEG

## 2013-12-14 LAB — CBC
HCT: 38.9 % (ref 36.0–46.0)
Hemoglobin: 12.3 g/dL (ref 12.0–15.0)
MCH: 28.5 pg (ref 26.0–34.0)
MCHC: 31.6 g/dL (ref 30.0–36.0)
MCV: 90 fL (ref 78.0–100.0)
Platelets: 309 10*3/uL (ref 150–400)
RBC: 4.32 MIL/uL (ref 3.87–5.11)
RDW: 18.9 % — ABNORMAL HIGH (ref 11.5–15.5)
WBC: 8.9 10*3/uL (ref 4.0–10.5)

## 2013-12-14 LAB — SURGICAL PCR SCREEN
MRSA, PCR: NEGATIVE
Staphylococcus aureus: NEGATIVE

## 2013-12-14 LAB — PROTIME-INR
INR: 1.11 (ref 0.00–1.49)
Prothrombin Time: 14.4 seconds (ref 11.6–15.2)

## 2013-12-14 LAB — APTT: aPTT: 30 seconds (ref 24–37)

## 2013-12-14 NOTE — Progress Notes (Signed)
UA / BMET  Faxed to Dr. Maureen Ralphs Blackman

## 2013-12-14 NOTE — Patient Instructions (Addendum)
Gerhard PerchesBarbara Nasby  12/14/2013                           YOUR PROCEDURE IS SCHEDULED ON: 12/23/13               ENTER THRU  CANCER CENTER  ENTRANCE AND                            FOLLOW  SIGNS TO SHORT STAY CENTER                 ARRIVE AT SHORT STAY AT:  7:40 AM               CALL THIS NUMBER IF ANY PROBLEMS THE DAY OF SURGERY :               [phone]                                REMEMBER:   Do not eat food or drink liquids AFTER MIDNIGHT                  Take these medicines the morning of surgery with               A SIPS OF WATER :    METOPROLOL / PROTONIX     Do not wear jewelry, make-up   Do not wear lotions, powders, or perfumes.   Do not shave legs or underarms 12 hrs. before surgery (men may shave face)  Do not bring valuables to the hospital.  Contacts, dentures or bridgework may not be worn into surgery.  Leave suitcase in the car. After surgery it may be brought to your room.  For patients admitted to the hospital more than one night, checkout time is            11:00 AM                                                         ________________________________________________________________________                                                                                                  [facility] - PREPARING FOR SURGERY  Before surgery, you can play an important role.  Because skin is not sterile, your skin needs to be as free of germs as possible.  You can reduce the number of germs on your skin by washing with CHG (chlorahexidine gluconate) soap before surgery.  CHG is an antiseptic cleaner which kills germs and bonds with the skin to continue killing germs even after washing. Please DO NOT use if you have an allergy to CHG or antibacterial soaps.  If your skin becomes reddened/irritated stop using the CHG and inform your nurse  when you arrive at Short Stay. Do not shave (including legs and underarms) for at least 48 hours prior  to the first CHG shower.  You may shave your face. Please follow these instructions carefully:   1.  Shower with CHG Soap the night before surgery and the  morning of Surgery.   2.  If you choose to wash your hair, wash your hair first as usual with your  normal  Shampoo.   3.  After you shampoo, rinse your hair and body thoroughly to remove the  shampoo.                                         4.  Use CHG as you would any other liquid soap.  You can apply chg directly  to the skin and wash . Gently wash with scrungie or clean wascloth    5.  Apply the CHG Soap to your body ONLY FROM THE NECK DOWN.   Do not use on open                           Wound or open sores. Avoid contact with eyes, ears mouth and genitals (private parts).                        Genitals (private parts) with your normal soap.              6.  Wash thoroughly, paying special attention to the area where your surgery  will be performed.   7.  Thoroughly rinse your body with warm water from the neck down.   8.  DO NOT shower/wash with your normal soap after using and rinsing off  the CHG Soap .                9.  Pat yourself dry with a clean towel.             10.  Wear clean pajamas.             11.  Place clean sheets on your bed the night of your first shower and do not  sleep with pets.  Day of Surgery : Do not apply any lotions/deodorants the morning of surgery.  Please wear clean clothes to the hospital/surgery center.  FAILURE TO FOLLOW THESE INSTRUCTIONS MAY RESULT IN THE CANCELLATION OF YOUR SURGERY    PATIENT SIGNATURE_________________________________  ______________________________________________________________________

## 2013-12-15 ENCOUNTER — Encounter (HOSPITAL_COMMUNITY): Payer: Self-pay

## 2013-12-15 ENCOUNTER — Ambulatory Visit: Payer: Medicare Other | Attending: Orthopaedic Surgery | Admitting: Physical Therapy

## 2013-12-15 DIAGNOSIS — R262 Difficulty in walking, not elsewhere classified: Secondary | ICD-10-CM | POA: Diagnosis not present

## 2013-12-15 DIAGNOSIS — M25552 Pain in left hip: Secondary | ICD-10-CM | POA: Insufficient documentation

## 2013-12-16 NOTE — Progress Notes (Signed)
Spoke with Dr. Council Mechanicenenny concerning pt hx of valve replacement 2014 and if pt would need cardiac clearance. He said it would be a good idea to notify her cardiologist that she was having surgery but pt did not need specific clearance. I spoke with Eunice Blaseebbie at office and relayed information. She will make sure it is handled before pt's surgery.

## 2013-12-21 ENCOUNTER — Ambulatory Visit: Payer: Medicare Other | Admitting: Physical Therapy

## 2013-12-21 DIAGNOSIS — M25552 Pain in left hip: Secondary | ICD-10-CM | POA: Diagnosis not present

## 2013-12-23 ENCOUNTER — Inpatient Hospital Stay (HOSPITAL_COMMUNITY): Payer: Medicare Other

## 2013-12-23 ENCOUNTER — Encounter (HOSPITAL_COMMUNITY): Payer: Self-pay | Admitting: *Deleted

## 2013-12-23 ENCOUNTER — Inpatient Hospital Stay (HOSPITAL_COMMUNITY): Admission: RE | Admit: 2013-12-23 | Payer: Medicare Other | Source: Ambulatory Visit | Admitting: Orthopaedic Surgery

## 2013-12-23 ENCOUNTER — Encounter (HOSPITAL_COMMUNITY)
Admission: RE | Disposition: A | Discharge status: Skilled Nursing Facility | Payer: Self-pay | Source: Ambulatory Visit | Attending: Orthopaedic Surgery

## 2013-12-23 ENCOUNTER — Inpatient Hospital Stay (HOSPITAL_COMMUNITY): Payer: Medicare Other | Admitting: Anesthesiology

## 2013-12-23 ENCOUNTER — Encounter (HOSPITAL_COMMUNITY): Admission: RE | Payer: Self-pay | Source: Ambulatory Visit

## 2013-12-23 ENCOUNTER — Inpatient Hospital Stay (HOSPITAL_COMMUNITY)
Admission: RE | Admit: 2013-12-23 | Discharge: 2013-12-26 | DRG: 470 | Disposition: A | Discharge status: Skilled Nursing Facility | Payer: Medicare Other | Source: Ambulatory Visit | Attending: Orthopaedic Surgery | Admitting: Orthopaedic Surgery

## 2013-12-23 ENCOUNTER — Encounter (HOSPITAL_COMMUNITY): Payer: Medicare Other | Admitting: Anesthesiology

## 2013-12-23 DIAGNOSIS — Z01812 Encounter for preprocedural laboratory examination: Secondary | ICD-10-CM

## 2013-12-23 DIAGNOSIS — Z952 Presence of prosthetic heart valve: Secondary | ICD-10-CM

## 2013-12-23 DIAGNOSIS — Z96642 Presence of left artificial hip joint: Secondary | ICD-10-CM

## 2013-12-23 DIAGNOSIS — Z880 Allergy status to penicillin: Secondary | ICD-10-CM

## 2013-12-23 DIAGNOSIS — I10 Essential (primary) hypertension: Secondary | ICD-10-CM | POA: Diagnosis present

## 2013-12-23 DIAGNOSIS — M25561 Pain in right knee: Secondary | ICD-10-CM | POA: Diagnosis present

## 2013-12-23 DIAGNOSIS — M25551 Pain in right hip: Secondary | ICD-10-CM | POA: Diagnosis present

## 2013-12-23 DIAGNOSIS — M160 Bilateral primary osteoarthritis of hip: Principal | ICD-10-CM | POA: Diagnosis present

## 2013-12-23 DIAGNOSIS — Z96649 Presence of unspecified artificial hip joint: Secondary | ICD-10-CM

## 2013-12-23 DIAGNOSIS — M25452 Effusion, left hip: Secondary | ICD-10-CM | POA: Diagnosis present

## 2013-12-23 DIAGNOSIS — Z9842 Cataract extraction status, left eye: Secondary | ICD-10-CM | POA: Diagnosis not present

## 2013-12-23 DIAGNOSIS — Z8711 Personal history of peptic ulcer disease: Secondary | ICD-10-CM | POA: Diagnosis not present

## 2013-12-23 DIAGNOSIS — M179 Osteoarthritis of knee, unspecified: Secondary | ICD-10-CM | POA: Diagnosis present

## 2013-12-23 DIAGNOSIS — D62 Acute posthemorrhagic anemia: Secondary | ICD-10-CM | POA: Diagnosis not present

## 2013-12-23 DIAGNOSIS — M25552 Pain in left hip: Secondary | ICD-10-CM | POA: Diagnosis present

## 2013-12-23 DIAGNOSIS — Z9841 Cataract extraction status, right eye: Secondary | ICD-10-CM | POA: Diagnosis not present

## 2013-12-23 DIAGNOSIS — M1612 Unilateral primary osteoarthritis, left hip: Secondary | ICD-10-CM

## 2013-12-23 DIAGNOSIS — Z86010 Personal history of colonic polyps: Secondary | ICD-10-CM

## 2013-12-23 HISTORY — PX: TOTAL HIP ARTHROPLASTY: SHX124

## 2013-12-23 SURGERY — ARTHROPLASTY, HIP, TOTAL, ANTERIOR APPROACH
Anesthesia: Spinal | Site: Hip | Laterality: Left

## 2013-12-23 MED ORDER — METOPROLOL TARTRATE 25 MG PO TABS
25.0000 mg | ORAL_TABLET | Freq: Every day | ORAL | Status: DC
  Administered 2013-12-24 – 2013-12-26 (×3): 25 mg via ORAL
  Filled 2013-12-23 (×3): qty 1

## 2013-12-23 MED ORDER — DIPHENHYDRAMINE HCL 12.5 MG/5ML PO ELIX: 12.5000 mg | ORAL_SOLUTION | ORAL | Status: DC | PRN

## 2013-12-23 MED ORDER — ZOLPIDEM TARTRATE 5 MG PO TABS: 5.0000 mg | ORAL_TABLET | Freq: Every evening | ORAL | Status: DC | PRN

## 2013-12-23 MED ORDER — FERROUS SULFATE 325 (65 FE) MG PO TABS
325.0000 mg | ORAL_TABLET | Freq: Three times a day (TID) | ORAL | Status: DC
  Administered 2013-12-24 – 2013-12-26 (×7): 325 mg via ORAL
  Filled 2013-12-23 (×11): qty 1

## 2013-12-23 MED ORDER — FENTANYL CITRATE 0.05 MG/ML IJ SOLN: 25.0000 ug | INTRAMUSCULAR | Status: DC | PRN

## 2013-12-23 MED ORDER — METOCLOPRAMIDE HCL 5 MG/ML IJ SOLN: 5.0000 mg | Freq: Three times a day (TID) | INTRAMUSCULAR | Status: DC | PRN

## 2013-12-23 MED ORDER — CLINDAMYCIN PHOSPHATE 900 MG/50ML IV SOLN
900.0000 mg | INTRAVENOUS | Status: AC
  Administered 2013-12-23: 900 mg via INTRAVENOUS

## 2013-12-23 MED ORDER — ACETAMINOPHEN 650 MG RE SUPP: 650.0000 mg | Freq: Four times a day (QID) | RECTAL | Status: DC | PRN

## 2013-12-23 MED ORDER — HYDROMORPHONE HCL 1 MG/ML IJ SOLN
0.5000 mg | INTRAMUSCULAR | Status: DC | PRN
  Administered 2013-12-23: 0.5 mg via INTRAVENOUS
  Filled 2013-12-23: qty 1

## 2013-12-23 MED ORDER — POLYETHYLENE GLYCOL 3350 17 G PO PACK
17.0000 g | PACK | Freq: Every day | ORAL | Status: DC | PRN
  Administered 2013-12-24 – 2013-12-25 (×2): 17 g via ORAL

## 2013-12-23 MED ORDER — MEPERIDINE HCL 50 MG/ML IJ SOLN: 6.2500 mg | INTRAMUSCULAR | Status: DC | PRN

## 2013-12-23 MED ORDER — MIDAZOLAM HCL 5 MG/5ML IJ SOLN
INTRAMUSCULAR | Status: DC | PRN
  Administered 2013-12-23: 2 mg via INTRAVENOUS

## 2013-12-23 MED ORDER — FENTANYL CITRATE 0.05 MG/ML IJ SOLN
INTRAMUSCULAR | Status: DC | PRN
  Administered 2013-12-23: 100 ug via INTRAVENOUS

## 2013-12-23 MED ORDER — PHENOL 1.4 % MT LIQD: 1.0000 | OROMUCOSAL | Status: DC | PRN

## 2013-12-23 MED ORDER — PHENYLEPHRINE 40 MCG/ML (10ML) SYRINGE FOR IV PUSH (FOR BLOOD PRESSURE SUPPORT)
PREFILLED_SYRINGE | INTRAVENOUS | Status: AC
  Filled 2013-12-23: qty 10

## 2013-12-23 MED ORDER — 0.9 % SODIUM CHLORIDE (POUR BTL) OPTIME
TOPICAL | Status: DC | PRN
  Administered 2013-12-23: 1000 mL

## 2013-12-23 MED ORDER — PROPOFOL 10 MG/ML IV BOLUS
INTRAVENOUS | Status: AC
  Filled 2013-12-23: qty 20

## 2013-12-23 MED ORDER — ACETAMINOPHEN 325 MG PO TABS
650.0000 mg | ORAL_TABLET | Freq: Four times a day (QID) | ORAL | Status: DC | PRN
  Administered 2013-12-24 – 2013-12-26 (×3): 650 mg via ORAL
  Filled 2013-12-23 (×3): qty 2

## 2013-12-23 MED ORDER — CLINDAMYCIN PHOSPHATE 600 MG/50ML IV SOLN
600.0000 mg | Freq: Four times a day (QID) | INTRAVENOUS | Status: AC
  Administered 2013-12-23 (×2): 600 mg via INTRAVENOUS
  Filled 2013-12-23 (×2): qty 50

## 2013-12-23 MED ORDER — CALCIUM CARBONATE ANTACID 500 MG PO CHEW: 1.0000 | CHEWABLE_TABLET | ORAL | Status: DC | PRN

## 2013-12-23 MED ORDER — ONDANSETRON HCL 4 MG/2ML IJ SOLN: 4.0000 mg | Freq: Four times a day (QID) | INTRAMUSCULAR | Status: DC | PRN

## 2013-12-23 MED ORDER — OXYCODONE HCL 5 MG PO TABS
5.0000 mg | ORAL_TABLET | ORAL | Status: DC | PRN
  Administered 2013-12-23 – 2013-12-25 (×7): 10 mg via ORAL
  Filled 2013-12-23 (×7): qty 2

## 2013-12-23 MED ORDER — LACTATED RINGERS IV SOLN
INTRAVENOUS | Status: DC
  Administered 2013-12-23: 1000 mL via INTRAVENOUS
  Administered 2013-12-23 (×2): via INTRAVENOUS

## 2013-12-23 MED ORDER — ONDANSETRON HCL 4 MG/2ML IJ SOLN
INTRAMUSCULAR | Status: AC
  Filled 2013-12-23: qty 2

## 2013-12-23 MED ORDER — SACCHAROMYCES BOULARDII 250 MG PO CAPS
250.0000 mg | ORAL_CAPSULE | Freq: Every day | ORAL | Status: DC
  Administered 2013-12-24 – 2013-12-26 (×3): 250 mg via ORAL
  Filled 2013-12-23 (×3): qty 1

## 2013-12-23 MED ORDER — ASPIRIN EC 325 MG PO TBEC
325.0000 mg | DELAYED_RELEASE_TABLET | Freq: Two times a day (BID) | ORAL | Status: DC
  Administered 2013-12-23 – 2013-12-26 (×6): 325 mg via ORAL
  Filled 2013-12-23 (×8): qty 1

## 2013-12-23 MED ORDER — EPHEDRINE SULFATE 50 MG/ML IJ SOLN
INTRAMUSCULAR | Status: DC | PRN
  Administered 2013-12-23 (×2): 5 mg via INTRAVENOUS
  Administered 2013-12-23: 10 mg via INTRAVENOUS

## 2013-12-23 MED ORDER — VITAMIN D 1000 UNITS PO TABS
2000.0000 [IU] | ORAL_TABLET | Freq: Every day | ORAL | Status: DC
  Administered 2013-12-24 – 2013-12-26 (×3): 2000 [IU] via ORAL
  Filled 2013-12-23 (×4): qty 2

## 2013-12-23 MED ORDER — BUPIVACAINE HCL (PF) 0.5 % IJ SOLN
INTRAMUSCULAR | Status: DC | PRN
  Administered 2013-12-23: 3 mL

## 2013-12-23 MED ORDER — DOCUSATE SODIUM 100 MG PO CAPS
100.0000 mg | ORAL_CAPSULE | Freq: Two times a day (BID) | ORAL | Status: DC
  Administered 2013-12-23 – 2013-12-26 (×6): 100 mg via ORAL

## 2013-12-23 MED ORDER — PROPOFOL 10 MG/ML IV BOLUS
INTRAVENOUS | Status: DC | PRN
  Administered 2013-12-23: 20 mg via INTRAVENOUS

## 2013-12-23 MED ORDER — SODIUM CHLORIDE 0.9 % IV SOLN
INTRAVENOUS | Status: DC
  Administered 2013-12-23: 20:00:00 via INTRAVENOUS
  Administered 2013-12-23: 1000 mL via INTRAVENOUS

## 2013-12-23 MED ORDER — SODIUM CHLORIDE 0.9 % IR SOLN
Status: DC | PRN
  Administered 2013-12-23: 1000 mL

## 2013-12-23 MED ORDER — METOCLOPRAMIDE HCL 10 MG PO TABS: 5.0000 mg | ORAL_TABLET | Freq: Three times a day (TID) | ORAL | Status: DC | PRN

## 2013-12-23 MED ORDER — SODIUM CHLORIDE 0.9 % IV SOLN
10.0000 mg | INTRAVENOUS | Status: DC | PRN
  Administered 2013-12-23: 50 ug/min via INTRAVENOUS

## 2013-12-23 MED ORDER — METHOCARBAMOL 500 MG PO TABS
500.0000 mg | ORAL_TABLET | Freq: Four times a day (QID) | ORAL | Status: DC | PRN
  Administered 2013-12-23 – 2013-12-26 (×8): 500 mg via ORAL
  Filled 2013-12-23 (×8): qty 1

## 2013-12-23 MED ORDER — PROMETHAZINE HCL 25 MG/ML IJ SOLN: 6.2500 mg | INTRAMUSCULAR | Status: DC | PRN

## 2013-12-23 MED ORDER — MIDAZOLAM HCL 2 MG/2ML IJ SOLN
INTRAMUSCULAR | Status: AC
  Filled 2013-12-23: qty 2

## 2013-12-23 MED ORDER — PHENYLEPHRINE HCL 10 MG/ML IJ SOLN
INTRAMUSCULAR | Status: DC | PRN
  Administered 2013-12-23 (×8): 80 ug via INTRAVENOUS

## 2013-12-23 MED ORDER — ALUM & MAG HYDROXIDE-SIMETH 200-200-20 MG/5ML PO SUSP: 30.0000 mL | ORAL | Status: DC | PRN

## 2013-12-23 MED ORDER — DEXTROSE 5 % IV SOLN
500.0000 mg | Freq: Four times a day (QID) | INTRAVENOUS | Status: DC | PRN
  Filled 2013-12-23: qty 5

## 2013-12-23 MED ORDER — FENTANYL CITRATE 0.05 MG/ML IJ SOLN
INTRAMUSCULAR | Status: AC
  Filled 2013-12-23: qty 2

## 2013-12-23 MED ORDER — MENTHOL 3 MG MT LOZG: 1.0000 | LOZENGE | OROMUCOSAL | Status: DC | PRN

## 2013-12-23 MED ORDER — PANTOPRAZOLE SODIUM 40 MG PO TBEC
40.0000 mg | DELAYED_RELEASE_TABLET | Freq: Every day | ORAL | Status: DC
  Administered 2013-12-24 – 2013-12-26 (×3): 40 mg via ORAL
  Filled 2013-12-23 (×3): qty 1

## 2013-12-23 MED ORDER — BUPIVACAINE HCL (PF) 0.5 % IJ SOLN
INTRAMUSCULAR | Status: AC
  Filled 2013-12-23: qty 30

## 2013-12-23 MED ORDER — PROPOFOL INFUSION 10 MG/ML OPTIME
INTRAVENOUS | Status: DC | PRN
  Administered 2013-12-23: 50 ug/kg/min via INTRAVENOUS

## 2013-12-23 MED ORDER — ONDANSETRON HCL 4 MG PO TABS: 4.0000 mg | ORAL_TABLET | Freq: Four times a day (QID) | ORAL | Status: DC | PRN

## 2013-12-23 MED ORDER — ACETAMINOPHEN 500 MG PO TABS
1000.0000 mg | ORAL_TABLET | Freq: Four times a day (QID) | ORAL | Status: AC
  Administered 2013-12-23 – 2013-12-24 (×3): 1000 mg via ORAL
  Filled 2013-12-23 (×4): qty 2

## 2013-12-23 SURGICAL SUPPLY — 42 items
APL SKNCLS STERI-STRIP NONHPOA (GAUZE/BANDAGES/DRESSINGS) ×1
BAG SPEC THK2 15X12 ZIP CLS (MISCELLANEOUS) ×1
BAG ZIPLOCK 12X15 (MISCELLANEOUS) ×2 IMPLANT
BENZOIN TINCTURE PRP APPL 2/3 (GAUZE/BANDAGES/DRESSINGS) ×2 IMPLANT
BLADE SAW SGTL 18X1.27X75 (BLADE) ×2 IMPLANT
BLADE SAW SGTL 18X1.27X75MM (BLADE) ×1
CAPT HIP PF COP ×2 IMPLANT
CATH FOLEY LATEX FREE 16FR (CATHETERS) ×2 IMPLANT
CELLS DAT CNTRL 66122 CELL SVR (MISCELLANEOUS) ×1 IMPLANT
CLOSURE WOUND 1/2 X4 (GAUZE/BANDAGES/DRESSINGS) ×1
COVER PERINEAL POST (MISCELLANEOUS) ×3 IMPLANT
DRAPE C-ARM 42X120 X-RAY (DRAPES) ×3 IMPLANT
DRAPE STERI IOBAN 125X83 (DRAPES) ×3 IMPLANT
DRAPE U-SHAPE 47X51 STRL (DRAPES) ×9 IMPLANT
DRSG AQUACEL AG ADV 3.5X10 (GAUZE/BANDAGES/DRESSINGS) ×3 IMPLANT
DURAPREP 26ML APPLICATOR (WOUND CARE) ×3 IMPLANT
ELECT BLADE TIP CTD 4 INCH (ELECTRODE) ×3 IMPLANT
ELECT REM PT RETURN 9FT ADLT (ELECTROSURGICAL) ×3
ELECTRODE REM PT RTRN 9FT ADLT (ELECTROSURGICAL) ×1 IMPLANT
FACESHIELD WRAPAROUND (MASK) ×9 IMPLANT
FACESHIELD WRAPAROUND OR TEAM (MASK) ×4 IMPLANT
GLOVE BIO SURGEON STRL SZ7.5 (GLOVE) ×3 IMPLANT
GLOVE BIOGEL PI IND STRL 8 (GLOVE) ×2 IMPLANT
GLOVE BIOGEL PI INDICATOR 8 (GLOVE) ×4
GLOVE ECLIPSE 8.0 STRL XLNG CF (GLOVE) ×3 IMPLANT
GOWN STRL REUS W/TWL XL LVL3 (GOWN DISPOSABLE) ×6 IMPLANT
HANDPIECE INTERPULSE COAX TIP (DISPOSABLE) ×3
KIT BASIN OR (CUSTOM PROCEDURE TRAY) ×3 IMPLANT
PACK TOTAL JOINT (CUSTOM PROCEDURE TRAY) ×3 IMPLANT
RETRACTOR WND ALEXIS 18 MED (MISCELLANEOUS) ×1 IMPLANT
RTRCTR WOUND ALEXIS 18CM MED (MISCELLANEOUS) ×3
SET HNDPC FAN SPRY TIP SCT (DISPOSABLE) ×1 IMPLANT
STRIP CLOSURE SKIN 1/2X4 (GAUZE/BANDAGES/DRESSINGS) ×1 IMPLANT
SUT ETHIBOND NAB CT1 #1 30IN (SUTURE) ×3 IMPLANT
SUT ETHILON 3 0 PS 1 (SUTURE) ×2 IMPLANT
SUT MNCRL AB 4-0 PS2 18 (SUTURE) ×2 IMPLANT
SUT VIC AB 0 CT1 36 (SUTURE) ×3 IMPLANT
SUT VIC AB 1 CT1 36 (SUTURE) ×3 IMPLANT
SUT VIC AB 2-0 CT1 27 (SUTURE) ×6
SUT VIC AB 2-0 CT1 TAPERPNT 27 (SUTURE) ×2 IMPLANT
TOWEL OR 17X26 10 PK STRL BLUE (TOWEL DISPOSABLE) ×3 IMPLANT
TOWEL OR NON WOVEN STRL DISP B (DISPOSABLE) ×3 IMPLANT

## 2013-12-23 NOTE — Plan of Care (Signed)
Problem: Consults Goal: Diagnosis- Total Joint Replacement Left anterior hip     

## 2013-12-23 NOTE — Progress Notes (Signed)
Utilization review completed.  

## 2013-12-23 NOTE — Anesthesia Preprocedure Evaluation (Signed)
Anesthesia Evaluation  Patient identified by MRN, date of birth, ID band Patient awake    Reviewed: Allergy & Precautions, H&P , NPO status , Patient's Chart, lab work & pertinent test results  Airway Mallampati: II TM Distance: >3 FB Neck ROM: Full    Dental no notable dental hx.    Pulmonary neg pulmonary ROS,  breath sounds clear to auscultation  Pulmonary exam normal       Cardiovascular hypertension, Pt. on medications + Valvular Problems/Murmurs (s/p AVR 2014 Tissue valve) Rhythm:Regular Rate:Normal     Neuro/Psych negative neurological ROS  negative psych ROS   GI/Hepatic negative GI ROS, Neg liver ROS,   Endo/Other  negative endocrine ROS  Renal/GU negative Renal ROS  negative genitourinary   Musculoskeletal negative musculoskeletal ROS (+)   Abdominal   Peds negative pediatric ROS (+)  Hematology negative hematology ROS (+)   Anesthesia Other Findings   Reproductive/Obstetrics negative OB ROS                           Anesthesia Physical Anesthesia Plan  ASA: III  Anesthesia Plan: Spinal   Post-op Pain Management:    Induction:   Airway Management Planned: Simple Face Mask  Additional Equipment:   Intra-op Plan:   Post-operative Plan:   Informed Consent: I have reviewed the patients History and Physical, chart, labs and discussed the procedure including the risks, benefits and alternatives for the proposed anesthesia with the patient or authorized representative who has indicated his/her understanding and acceptance.   Dental advisory given  Plan Discussed with: CRNA  Anesthesia Plan Comments:         Anesthesia Quick Evaluation

## 2013-12-23 NOTE — Transfer of Care (Signed)
Immediate Anesthesia Transfer of Care Note  Patient: [name] PerchesBarbara Watford  Procedure(s) Performed: Procedure(s): TOTAL HIP ARTHROPLASTY ANTERIOR APPROACH, LEFT (Left)  Patient Location: PACU  Anesthesia Type:Spinal  Level of Consciousness: awake, alert , oriented and patient cooperative  Airway & Oxygen Therapy: Patient Spontanous Breathing and Patient connected to nasal cannula oxygen  Post-op Assessment: Report given to PACU RN, Post -op Vital signs reviewed and stable and SAB level T10.  Post vital signs: Reviewed and stable  Complications: No apparent anesthesia complications

## 2013-12-23 NOTE — H&P (Signed)
TOTAL HIP ADMISSION H&P  Patient is admitted for left total hip arthroplasty.  Subjective:  Chief Complaint: left hip pain  HPI: [name], 80 y.o. female, has a history of pain and functional disability in the left hip(s) due to trauma and patient has failed non-surgical conservative treatments for greater than 12 weeks to include NSAID's and/or analgesics, corticosteriod injections, flexibility and strengthening excercises, supervised PT with diminished ADL's post treatment, use of assistive devices and activity modification.  Onset of symptoms was gradual starting 3 years ago with gradually worsening course since that time.The patient noted no past surgery on the left hip(s).  Patient currently rates pain in the left hip at 9 out of 10 with activity. Patient has worsening of pain with activity and weight bearing, trendelenberg gait, pain that interfers with activities of daily living and pain with passive range of motion. Patient has evidence of subchondral cysts, subchondral sclerosis, periarticular osteophytes and joint space narrowing by imaging studies. This condition presents safety issues increasing the risk of falls.  There is no current active infection.  Patient Active Problem List   Diagnosis Date Noted  . Arthritis of left hip 12/23/2013  . Hematemesis 01/18/2013  . Acute duodenal ulcer with hemorrhage 01/18/2013  . Acute gastric ulcers with hemorrhage 01/18/2013  . GI bleed 01/17/2013  . H/O aortic valve replacement 01/17/2013   Past Medical History  Diagnosis Date  . Hypertension   . Pneumonia   . Arthritis     left hip  . Colon polyps     type not known, year(s) of occurrence not defined.   . Acute duodenal ulcer with hemorrhage 01/18/2013  . Acute gastric ulcers with hemorrhage 01/18/2013  . Aortic valve replaced   . Nocturia   . Anemia   . History of transfusion     Past Surgical History  Procedure Laterality Date  . Tissue aortic valve replacement  Nov 18 2012    at [facility]  . Dilation and curettage of uterus      x 2.  . Cataract extraction Bilateral   . Esophagogastroduodenoscopy N/A 01/18/2013    Procedure: ESOPHAGOGASTRODUODENOSCOPY (EGD);  Surgeon: Iva Booparl E Gessner, MD;  Location: [facility] ENDOSCOPY;  Service: Endoscopy;  Laterality: N/A;    No prescriptions prior to admission   Allergies  Allergen Reactions  . Penicillins Rash    History  Substance Use Topics  . Smoking status: Never Smoker   . Smokeless tobacco: Not on file  . Alcohol Use: No    Family History  Problem Relation Age of Onset  . Colon cancer Neg Hx   . Breast cancer Neg Hx   . Throat cancer Neg Hx   . Stomach cancer Neg Hx      Review of Systems  Musculoskeletal: Positive for joint pain.  All other systems reviewed and are negative.   Objective:  Physical Exam  Constitutional: She is oriented to person, place, and time. She appears well-developed and well-nourished.  HENT:  Head: Normocephalic and atraumatic.  Eyes: EOM are normal. Pupils are equal, round, and reactive to light.  Neck: Normal range of motion. Neck supple.  Cardiovascular: Normal rate and regular rhythm.   Respiratory: Effort normal and breath sounds normal.  GI: Soft. Bowel sounds are normal.  Musculoskeletal:       Left hip: She exhibits decreased range of motion, decreased strength and bony tenderness.  Neurological: She is alert and oriented to person, place, and time.  Skin: Skin is warm and dry.  Psychiatric: She has a normal mood and affect.    Vital signs in last 24 hours:    Labs:   Estimated body mass index is 25.82 kg/(m^2) as calculated from the following:   Height as of 12/14/13: 5\' 4"  (1.626 m).   Weight as of 12/14/13: 68.266 kg (150 lb 8 oz).   Imaging Review Plain radiographs demonstrate severe degenerative joint disease of the left hip(s). The bone quality appears to be good for age and reported activity level.  Assessment/Plan:  End stage arthritis,  left hip(s)  The patient history, physical examination, clinical judgement of the provider and imaging studies are consistent with end stage degenerative joint disease of the left hip(s) and total hip arthroplasty is deemed medically necessary. The treatment options including medical management, injection therapy, arthroscopy and arthroplasty were discussed at length. The risks and benefits of total hip arthroplasty were presented and reviewed. The risks due to aseptic loosening, infection, stiffness, dislocation/subluxation,  thromboembolic complications and other imponderables were discussed.  The patient acknowledged the explanation, agreed to proceed with the plan and consent was signed. Patient is being admitted for inpatient treatment for surgery, pain control, PT, OT, prophylactic antibiotics, VTE prophylaxis, progressive ambulation and ADL's and discharge planning.The patient is planning to be discharged to skilled nursing facility

## 2013-12-23 NOTE — Anesthesia Procedure Notes (Signed)
Spinal  Patient location during procedure: OR Start time: 12/23/2013 9:46 AM End time: 12/23/2013 9:51 AM Staffing CRNA/Resident: Alfonso Patten J Performed by: resident/CRNA  Preanesthetic Checklist Completed: patient identified, site marked, surgical consent, pre-op evaluation, timeout performed, IV checked, risks and benefits discussed and monitors and equipment checked Spinal Block Patient position: sitting Prep: Betadine and site prepped and draped Patient monitoring: heart rate, continuous pulse ox and blood pressure Approach: midline Location: L2-3 Injection technique: single-shot Needle Needle type: Sprotte  Needle gauge: 24 G Needle length: 9 cm Additional Notes Expiration date of kit checked and confirmed. Lot #[id number]  Exp date 2017-04. Patient tolerated procedure well, without complications, single attempt.  No c/o pain, parathesia.

## 2013-12-23 NOTE — Evaluation (Signed)
Physical Therapy Evaluation Patient Details Name: [name] PerchesBarbara [name] MRN: [id number] DOB: 05-05-1928 Today's Date: 12/23/2013   History of Present Illness  L THR  Clinical Impression  Pt s/p L THR presents with decreased L LE strength/ROM and post op pain and dizziness with mobility limiting functional mobility.  Pt would benefit from SNF level rehab to maximize IND and safety prior to return home alone.    Follow Up Recommendations SNF    Equipment Recommendations  None recommended by PT    Recommendations for Other Services       Precautions / Restrictions Precautions Precautions: Fall Restrictions Weight Bearing Restrictions: No Other Position/Activity Restrictions: WBAT      Mobility  Bed Mobility Overal bed mobility: Needs Assistance Bed Mobility: Supine to Sit     Supine to sit: Mod assist     General bed mobility comments: cues for sequence and use of R LE to self assist  Transfers Overall transfer level: Needs assistance Equipment used: Rolling walker (2 wheeled) Transfers: Sit to/from Stand Sit to Stand: Mod assist         General transfer comment: cues for LE management and use of UEs to self assist  Ambulation/Gait Ambulation/Gait assistance: Min assist;Mod assist Ambulation Distance (Feet): 5 Feet Assistive device: Rolling walker (2 wheeled) Gait Pattern/deviations: Step-to pattern;Decreased step length - right;Decreased step length - left;Shuffle     General Gait Details: cues for posture, sequence and position from RW - ltd by onset dizziness  Stairs            Wheelchair Mobility    Modified Rankin (Stroke Patients Only)       Balance                                             Pertinent Vitals/Pain Pain Assessment: 0-10 Pain Score: 3  Pain Location: L hip and R lower leg (from prior to surgery) Pain Descriptors / Indicators: Cramping;Sore Pain Intervention(s): Limited activity within patient's  tolerance;Monitored during session;Premedicated before session;Ice applied    Home Living Family/patient expects to be discharged to:: Skilled nursing facility Living Arrangements: Alone                    Prior Function Level of Independence: Independent with assistive device(s)               Hand Dominance   Dominant Hand: Right    Extremity/Trunk Assessment   Upper Extremity Assessment: Overall WFL for tasks assessed           Lower Extremity Assessment: LLE deficits/detail   LLE Deficits / Details: 2+/5 hip strength with AAROM at hip to 80 flex and 10 abd  Cervical / Trunk Assessment: Normal  Communication   Communication: No difficulties  Cognition Arousal/Alertness: Awake/alert Behavior During Therapy: WFL for tasks assessed/performed Overall Cognitive Status: Within Functional Limits for tasks assessed                      General Comments      Exercises Total Joint Exercises Ankle Circles/Pumps: AROM;Both;15 reps;Supine Heel Slides: AAROM;Left;20 reps;Supine Hip ABduction/ADduction: AAROM;Left;10 reps;Supine      Assessment/Plan    PT Assessment Patient needs continued PT services  PT Diagnosis Difficulty walking   PT Problem List Decreased strength;Decreased range of motion;Decreased activity tolerance;Decreased mobility;Decreased knowledge of use of DME;Pain  PT Treatment  Interventions DME instruction;Gait training;Stair training;Functional mobility training;Therapeutic activities;Therapeutic exercise;Patient/family education   PT Goals (Current goals can be found in the Care Plan section) Acute Rehab PT Goals Patient Stated Goal: Rehab and home to resume previous lifestyle with decreased pain PT Goal Formulation: With patient Time For Goal Achievement: 12/30/13 Potential to Achieve Goals: Good    Frequency 7X/week   Barriers to discharge        Co-evaluation               End of Session Equipment Utilized During  Treatment: Gait belt Activity Tolerance: Patient tolerated treatment well Patient left: in chair;with call bell/phone within reach;with family/visitor present Nurse Communication: Mobility status         Time: 1640-1710 PT Time Calculation (min): 30 min   Charges:   PT Evaluation $Initial PT Evaluation Tier I: 1 Procedure PT Treatments $Gait Training: 8-22 mins $Therapeutic Exercise: 8-22 mins   PT G Codes:          Diamante Rubin 12/23/2013, 6:11 PM

## 2013-12-23 NOTE — Anesthesia Postprocedure Evaluation (Signed)
  Anesthesia Post-op Note  Patient: [name] PerchesBarbara [name]  Procedure(s) Performed: Procedure(s) (LRB): TOTAL HIP ARTHROPLASTY ANTERIOR APPROACH, LEFT (Left)  Patient Location: PACU  Anesthesia Type: Spinal  Level of Consciousness: awake and alert   Airway and Oxygen Therapy: Patient Spontanous Breathing  Post-op Pain: mild  Post-op Assessment: Post-op Vital signs reviewed, Patient's Cardiovascular Status Stable, Respiratory Function Stable, Patent Airway and No signs of Nausea or vomiting  Last Vitals:  Filed Vitals:   12/23/13 1314  BP: 103/48  Pulse: 71  Temp:   Resp:     Post-op Vital Signs: stable   Complications: No apparent anesthesia complications

## 2013-12-23 NOTE — Brief Op Note (Signed)
12/23/2013  11:04 AM  PATIENT:  [name] PerchesBarbara Dinges  80 y.o. female  PRE-OPERATIVE DIAGNOSIS:  left hip osteoarthritis  POST-OPERATIVE DIAGNOSIS:  left hip osteoarthritis  PROCEDURE:  Procedure(s): TOTAL HIP ARTHROPLASTY ANTERIOR APPROACH, LEFT (Left)  SURGEON:  Surgeon(s) and Role:    * Kathryne Hitchhristopher Y Arlynn Stare, MD - Primary  PHYSICIAN ASSISTANT: Rexene EdisonGil Clark, PA-C  ANESTHESIA:   spinal  EBL:  Total I/O In: 2000 [I.V.:2000] Out: 550 [Urine:150; Blood:400]  BLOOD ADMINISTERED:none  DRAINS: none   LOCAL MEDICATIONS USED:  NONE  SPECIMEN:  No Specimen  DISPOSITION OF SPECIMEN:  N/A  COUNTS:  YES  TOURNIQUET:  * No tourniquets in log *  DICTATION: .Other Dictation: Dictation Number [id number]  PLAN OF CARE: Admit to inpatient   PATIENT DISPOSITION:  PACU - hemodynamically stable.   Delay start of Pharmacological VTE agent (>24hrs) due to surgical blood loss or risk of bleeding: no

## 2013-12-23 NOTE — Op Note (Signed)
NAMMarland Kitchen:  [name], [name]              ACCOUNT NO.:  [id number]  MEDICAL RECORD NO.:  [id number]  LOCATION:  [facility]                         [facility]  PHYSICIAN:  Vanita PandaChristopher Y. Magnus IvanBlackman, M.D.DATE OF BIRTH:  1928-08-07  DATE OF PROCEDURE:  12/23/2013 DATE OF DISCHARGE:                              OPERATIVE REPORT   PREOPERATIVE DIAGNOSES:  Severe end-stage arthritis and degenerative joint disease, left hip.  POSTOPERATIVE DIAGNOSES:  Severe end-stage arthritis and degenerative joint disease, left hip.  PROCEDURE:  Left total hip arthroplasty through direct anterior approach.  IMPLANTS:  DePuy Sector Gription acetabular component size 48 with an apex hole eliminator guide and a single screw, size 32+ 4 neutral polyethylene liner, size 11 Corail femoral component with standard offset, size 32+ 1 ceramic hip ball.  SURGEON:  Vanita PandaChristopher Y. Magnus IvanBlackman, M.D.  ASSISTANT:  Richardean CanalGilbert Clark, P.A.C.  ANESTHESIA:  Spinal.  ANTIBIOTICS:  900 mg IV clindamycin.  BLOOD LOSS:  Less than 400 mL.  COMPLICATIONS:  None.  INDICATIONS:  [name] is a very pleasant and active 80 year old female who has had quite a rough year with becoming less and less mobile.  She has left hip pain, right hip pain, and right knee pain. She has known arthritis in all these areas of the left hip being the most severe.  X-rays do show severe end-stage arthritis.  There are periarticular osteophytes, significant sclerotic bone cystic changes in the femoral head.  No joint space left at all.  She has some findings of her other joints.  At this point, with very conservative treatment, her pain being so bad in her mobility significant limited.  This is now severely impacted her quality of life and activities of daily living and her pain status.  She wishes to proceed with a total hip arthroplasty on the left side.  She understands the risk of acute blood loss anemia, nerve and vessel injury, fracture, flexion,  dislocation, DVT.  She understands the goals were decreased pain, improved mobility, and overall improved quality of life.  PROCEDURE DESCRIPTION:  After informed consent was obtained, appropriate left hip was marked.  She was brought to the operating room and spinal anesthesia was obtained while she was on her stretcher.  Foley catheter was placed and both feet had traction boots applied to them.  Next, she was placed supine on the Hana fracture table with the perineal post in place and both legs in inline skeletal traction devices, no traction applied.  Her left operative hip was then prepped and draped with DuraPrep and sterile drapes.  A time-out was called.  She was identified as correct patient, correct left hip.  I then made an incision inferior and posterior to the anterosuperior iliac spine and carried this obliquely down the leg.  I dissected down the tensor fascia lata muscle and the tensor fascia was then divided longitudinally, so we could proceed with the direct anterior approach to the hip.  We cauterized the lateral femoral circumflex vessels and opened up the hip capsule in a L- type format.  We placed the Cobra retractors within the hip capsule.  We found a large joint effusion and significant disease throughout her left hip joint.  We made a femoral neck cut with an oscillating saw just proximal to the lesser trochanter and completed this with an osteotome. We placed a corkscrew guide in the femoral head.  I removed the femoral head in its entirety and found to be completely devoid of cartilage.  It was significantly sclerotic and hard.  We then placed a bent Hohmann along the medial acetabular remnant and a Cobra retractor laterally.  We cleaned the acetabulum debris including remnants of acetabular labrum. We then began reaming from a size 42 reamer up to a size 48 with all reamers under direct visualization and the last reamer also under direct fluoroscopy, so we  could obtain our depth of reaming under inclination and anteversion.  Once we are pleased with this, we placed a real DePuy Sector Gription acetabular component size 48 and she had good bleeding bone.  We placed an apex hole eliminator guide and I still placed a single screw.  We then placed a real 32+ 4 neutral polyethylene liner for 48 size acetabular component.  Attention was then turned to the femur with the leg externally rotated to 100 degrees extended and adducted, we were open and able to place a Mueller retractor medially and a Hohmann retractor behind the greater trochanter.  We released the lateral joint capsule and used a box cutting osteotome to enter the femoral canal and a rongeur to lateralize.  We then began broaching from a size 8 broach using a Corail broaching system up to a size 11.  With a size 11, we trialed a standard neck and a 32+ 1 hip ball.  We brought the leg back up and over with traction, internal rotation, reduced in the pelvis and it was stable but also some leg lengths measured and equal preoperative.  She also had equal leg lengths preoperative.  We then dislocated the hip and removed the trial components.  I placed a real Corail femoral component size 11 with standard offset and the real 32+ 1 ceramic hip ball and reduced this in the acetabulum and again it was stable.  We copiously irrigated the soft tissue and joint with normal saline solution using pulsatile lavage.  We closed the joint capsule with interrupted #1 Ethibond suture followed by a running #1 Vicryl in the tensor fascia, 0 Vicryl in the deep tissue, 2-0 Vicryl in subcutaneous tissue, 4-0 Monocryl subcuticular stitch and Steri-Strips on the skin.  An Aquacel dressing was applied.  She was then taken off the Hana table to the recovery room in stable condition.  All final counts were correct.  There were no complications noted.  Of note, Richardean CanalGilbert Clark, P.A.C. assisted during the entire case  and his assistance was crucial for facilitating all facets and levels of this case.     Vanita Pandahristopher Y. Magnus IvanBlackman, M.D.     CYB/MEDQ  D:  12/23/2013  T:  12/23/2013  Job:  [id number]

## 2013-12-24 LAB — HEMOGLOBIN AND HEMATOCRIT, BLOOD
HCT: 28.3 % — ABNORMAL LOW (ref 36.0–46.0)
Hemoglobin: 9.3 g/dL — ABNORMAL LOW (ref 12.0–15.0)

## 2013-12-24 LAB — CBC
HCT: 25.5 % — ABNORMAL LOW (ref 36.0–46.0)
Hemoglobin: 8.2 g/dL — ABNORMAL LOW (ref 12.0–15.0)
MCH: 29.8 pg (ref 26.0–34.0)
MCHC: 32.2 g/dL (ref 30.0–36.0)
MCV: 92.7 fL (ref 78.0–100.0)
Platelets: 178 10*3/uL (ref 150–400)
RBC: 2.75 MIL/uL — ABNORMAL LOW (ref 3.87–5.11)
RDW: 19 % — ABNORMAL HIGH (ref 11.5–15.5)
WBC: 6.8 10*3/uL (ref 4.0–10.5)

## 2013-12-24 LAB — BASIC METABOLIC PANEL
Anion gap: 10 (ref 5–15)
BUN: 18 mg/dL (ref 6–23)
CO2: 24 mEq/L (ref 19–32)
Calcium: 8.4 mg/dL (ref 8.4–10.5)
Chloride: 106 mEq/L (ref 96–112)
Creatinine, Ser: 1.01 mg/dL (ref 0.50–1.10)
GFR calc Af Amer: 58 mL/min — ABNORMAL LOW (ref >90)
GFR calc non Af Amer: 50 mL/min — ABNORMAL LOW (ref >90)
Glucose, Bld: 109 mg/dL — ABNORMAL HIGH (ref 70–99)
Potassium: 4.5 mEq/L (ref 3.7–5.3)
Sodium: 140 mEq/L (ref 137–147)

## 2013-12-24 LAB — PREPARE RBC (CROSSMATCH)

## 2013-12-24 MED ORDER — SODIUM CHLORIDE 0.9 % IV SOLN: Freq: Once | INTRAVENOUS | Status: DC

## 2013-12-24 NOTE — Evaluation (Signed)
Occupational Therapy Evaluation Patient Details Name: [name] PerchesBarbara [name] MRN: [id number] DOB: December 22, 1928 Today's Date: 12/24/2013    History of Present Illness L THR anterior, chronic back and R LE pain.   Clinical Impression   Pt was using assistive devices prior to admission for ADL and mobility. She lives alone and will need to post acute rehab prior to return home. Pt fatigues easily with short distance ambulation. She requires min assist for bathing, dressing and toileting.  Will defer further OT to SNF.    Follow Up Recommendations  SNF;Supervision/Assistance - 24 hour    Equipment Recommendations       Recommendations for Other Services       Precautions / Restrictions Precautions Precautions: Fall Restrictions Weight Bearing Restrictions: No Other Position/Activity Restrictions: WBAT      Mobility Bed Mobility      General bed mobility comments: Pt up in chair.  Transfers Overall transfer level: Needs assistance Equipment used: Rolling walker (2 wheeled) Transfers: Sit to/from Stand Sit to Stand: Min assist         General transfer comment: cues for LE management and use of UEs to self assist    Balance                                            ADL Overall ADL's : Needs assistance/impaired Eating/Feeding: Independent;Sitting   Grooming: Wash/dry hands;Supervision/safety;Standing   Upper Body Bathing: Set up;Sitting   Lower Body Bathing: Sit to/from stand;Minimal assistance   Upper Body Dressing : Set up;Sitting   Lower Body Dressing: Minimal assistance;Sit to/from stand   Toilet Transfer: Min guard;Ambulation;BSC;RW (over toilet)   Toileting- Clothing Manipulation and Hygiene: Min guard;Sit to/from stand       Functional mobility during ADLs: Min guard;Rolling walker General ADL Comments: Pt was routinely using AE for LB ADL prior to admission     Vision                     Perception     Praxis       Pertinent Vitals/Pain Pain Assessment: Faces Pain Score: 3  Faces Pain Scale: Hurts little more Pain Location: back, R LE Pain Descriptors / Indicators: Aching Pain Intervention(s): Monitored during session;Premedicated before session;Repositioned;Ice applied;Heat applied (ice to L hip, heat to lower back)     Hand Dominance Right   Extremity/Trunk Assessment Upper Extremity Assessment Upper Extremity Assessment: Overall WFL for tasks assessed   Lower Extremity Assessment Lower Extremity Assessment: Defer to PT evaluation   Cervical / Trunk Assessment Cervical / Trunk Assessment: Normal   Communication Communication Communication: No difficulties   Cognition Arousal/Alertness: Awake/alert Behavior During Therapy: Anxious Overall Cognitive Status: Within Functional Limits for tasks assessed                     General Comments       Exercises   Shoulder Instructions      Home Living Family/patient expects to be discharged to:: Skilled nursing facility Living Arrangements: Alone                                      Prior Functioning/Environment Level of Independence: Independent with assistive device(s)        Comments: used 4 wheeled walker prior to admission and  AE for LB ADL    OT Diagnosis: Generalized weakness;Acute pain   OT Problem List: Decreased strength;Decreased activity tolerance;Impaired balance (sitting and/or standing);Decreased knowledge of use of DME or AE;Pain   OT Treatment/Interventions:      OT Goals(Current goals can be found in the care plan section) Acute Rehab OT Goals Patient Stated Goal: Rehab and home to resume previous lifestyle with decreased pain  OT Frequency:     Barriers to D/C:            Co-evaluation              End of Session Nurse Communication: Other (comment) (IV alarming)  Activity Tolerance: Patient limited by fatigue Patient left: in chair;with call bell/phone within  reach;with family/visitor present   Time: 1018-1100 OT Time Calculation (min): 42 min Charges:  OT General Charges $OT Visit: 1 Procedure OT Evaluation $Initial OT Evaluation Tier I: 1 Procedure OT Treatments $Self Care/Home Management : 23-37 mins G-Codes:    [name], [name] 12/24/2013, 11:07 AM [phone]

## 2013-12-24 NOTE — Progress Notes (Signed)
Clinical Social Work Department BRIEF PSYCHOSOCIAL ASSESSMENT 12/24/2013  Patient:  [name],[name]     Account Number:  [id number]     Admit date:  12/23/2013  Clinical Social Worker:  Orpah GreekFOLEY,Dixie Coppa, LCSWA  Date/Time:  12/24/2013 03:04 PM  Referred by:  Physician  Date Referred:  12/24/2013 Referred for  SNF Placement   Other Referral:   Interview type:  Patient Other interview type:   and daughter, Diane at bedside    PSYCHOSOCIAL DATA Living Status:  ALONE Admitted from facility:   Level of care:   Primary support name:  Harlan StainsDiane Johnson (daughter) h#: [phone] c#: [phone] Primary support relationship to patient:  CHILD, ADULT Degree of support available:   good    CURRENT CONCERNS Current Concerns  Post-Acute Placement   Other Concerns:    SOCIAL WORK ASSESSMENT / PLAN CSW reviewed PT evaluation recommending SNF at discharge.   Assessment/plan status:  [facility] Other assessment/ plan:   Information/referral to community resources:   CSW completed FL2 and [place] [place] [facility] [place] per patient's request.    PATIENT'S/FAMILY'S RESPONSE TO PLAN OF CARE: Patient states that she has [facility] and would like to go there if possible, CSW left a message for Peggy awaiting call back. Otherwise, patient expressed interest in [facility] [place].       Lincoln MaxinKelly Marinell Igarashi, LCSW [facility] Clinical Social Worker cell #: [phone]

## 2013-12-24 NOTE — Progress Notes (Signed)
Patient ID: [name] PerchesBarbara Saephan, female   DOB: May 22, 1928, 80 y.o.   MRN: [id number] [name] does have slightly symptomatice acute blood loss anemia.  She agreed to transfusion of one unit PRBC's

## 2013-12-24 NOTE — Progress Notes (Signed)
Clinical Social Work Department CLINICAL SOCIAL WORK PLACEMENT NOTE 12/24/2013  Patient:  [name],[name]  Account Number:  [id number] Admit date:  12/23/2013  Clinical Social Worker:  Orpah GreekKELLY FOLEY, LCSWA  Date/time:  12/24/2013 03:09 PM  Clinical Social Work is seeking post-discharge placement for this patient at the following level of care:   SKILLED NURSING   (*CSW will update this form in Epic as items are completed)   12/24/2013  Patient/family provided with [facility] Department of Clinical Social Work's list of facilities offering this level of care within the geographic area requested by the patient (or if unable, by the patient's family).  12/24/2013  Patient/family informed of their freedom to choose among providers that offer the needed level of care, that participate in Medicare, Medicaid or managed care program needed by the patient, have an available bed and are willing to accept the patient.  12/24/2013  Patient/family informed of [facility]' ownership interest in [facility], as well as of the fact that they are under no obligation to receive care at this facility.  PASARR submitted to EDS on 12/24/2013 PASARR number received on 12/24/2013  FL2 transmitted to all facilities in geographic area requested by pt/family on  12/24/2013 FL2 transmitted to all facilities within larger geographic area on   Patient informed that his/her managed care company has contracts with or will negotiate with  certain facilities, including the following:     Patient/family informed of bed offers received:   Patient chooses bed at  Physician recommends and patient chooses bed at    Patient to be transferred to  on   Patient to be transferred to facility by  Patient and family notified of transfer on  Name of family member notified:    The following physician request were entered in Epic:   Additional Comments:   Lincoln MaxinKelly Thara Searing, LCSW [facility] Clinical Social Worker cell #: [phone]

## 2013-12-24 NOTE — Progress Notes (Signed)
   Subjective:  Patient reports pain as mild.  C/o some right thigh and knee pain.  Objective:   VITALS:   Filed Vitals:   12/24/13 0142 12/24/13 0400 12/24/13 0602 12/24/13 0800  BP: 121/54  96/46   Pulse: 113  102   Temp: 98.3 F (36.8 C)  98.9 F (37.2 C)   TempSrc: Oral  Oral   Resp: 17 18 16 16   Height:      Weight:      SpO2: 100% 100% 100% 100%    Neurologically intact Neurovascular intact Sensation intact distally Intact pulses distally Dorsiflexion/Plantar flexion intact Incision: dressing C/D/I and no drainage No cellulitis present Compartment soft   Lab Results  Component Value Date   WBC 6.8 12/24/2013   HGB 8.2* 12/24/2013   HCT 25.5* 12/24/2013   MCV 92.7 12/24/2013   PLT 178 12/24/2013     Assessment/Plan:  1 Day Post-Op   - Expected postop acute blood loss anemia - will monitor for symptoms; Hg 8.2 - Up with PT/OT - SNF - DVT ppx - SCDs, ambulation, asa - WBAT left lower extremity - Pain control - Discharge planning  Cheral AlmasXu, Naiping Michael 12/24/2013, 9:10 AM [phone]

## 2013-12-24 NOTE — Progress Notes (Signed)
Physical Therapy Treatment Patient Details Name: [name] PerchesBarbara [name] MRN: [id number] DOB: [birth year] Today's Date: 12/24/2013    History of Present Illness L THR    PT Comments    Pt pleasant and progressing well.  Ltd this am by fatigue with BP 94/47 but pt denying dizziness with ambulation.  Follow Up Recommendations  SNF     Equipment Recommendations  None recommended by PT    Recommendations for Other Services OT consult     Precautions / Restrictions Precautions Precautions: Fall Restrictions Weight Bearing Restrictions: No Other Position/Activity Restrictions: WBAT    Mobility  Bed Mobility Overal bed mobility: Needs Assistance Bed Mobility: Supine to Sit     Supine to sit: Mod assist     General bed mobility comments: cues for sequence and use of R LE to self assist  Transfers Overall transfer level: Needs assistance Equipment used: Rolling walker (2 wheeled) Transfers: Sit to/from Stand Sit to Stand: Min assist;Mod assist         General transfer comment: cues for LE management and use of UEs to self assist  Ambulation/Gait Ambulation/Gait assistance: Min assist Ambulation Distance (Feet): 48 Feet Assistive device: Rolling walker (2 wheeled) Gait Pattern/deviations: Step-to pattern;Step-through pattern;Decreased step length - right;Decreased step length - left;Shuffle;Trunk flexed     General Gait Details: cues for posture, sequence and position from RW - ltd by onset dizziness   Stairs            Wheelchair Mobility    Modified Rankin (Stroke Patients Only)       Balance                                    Cognition Arousal/Alertness: Awake/alert Behavior During Therapy: WFL for tasks assessed/performed Overall Cognitive Status: Within Functional Limits for tasks assessed                      Exercises Total Joint Exercises Ankle Circles/Pumps: AROM;Both;15 reps;Supine Quad Sets: AROM;Both;10  reps;Supine Heel Slides: AAROM;Left;20 reps;Supine Hip ABduction/ADduction: AAROM;Left;Supine;15 reps    General Comments        Pertinent Vitals/Pain Pain Assessment: 0-10 Pain Score: 3  Pain Location: L hip, R LE and back Pain Descriptors / Indicators: Aching;Sore Pain Intervention(s): Limited activity within patient's tolerance;Monitored during session;Premedicated before session;Ice applied    Home Living                      Prior Function            PT Goals (current goals can now be found in the care plan section) Acute Rehab PT Goals Patient Stated Goal: Rehab and home to resume previous lifestyle with decreased pain PT Goal Formulation: With patient Time For Goal Achievement: 12/30/13 Potential to Achieve Goals: Good Progress towards PT goals: Progressing toward goals    Frequency  7X/week    PT Plan Current plan remains appropriate    Co-evaluation             End of Session Equipment Utilized During Treatment: Gait belt Activity Tolerance: Patient tolerated treatment well Patient left: in chair;with call bell/phone within reach;with family/visitor present     Time: 6301-60100832-0857 PT Time Calculation (min): 25 min  Charges:  $Gait Training: 8-22 mins $Therapeutic Exercise: 8-22 mins  G Codes:      [name] 12/24/2013, 9:41 AM

## 2013-12-24 NOTE — Progress Notes (Signed)
CARE MANAGEMENT NOTE 12/24/2013  Patient:  [name],Olyvia   Account Number:  [id number]  Date Initiated:  12/24/2013  Documentation initiated by:  [facility]Lashika Erker  Subjective/Objective Assessment:   Left total hip arthroplasty     Action/Plan:   SNF   Anticipated DC Date:  12/26/2013   Anticipated DC Plan:  SKILLED NURSING FACILITY  In-house referral  Clinical Social Worker      DC Planning Services  CM consult      Choice offered to / List presented to:             Status of service:  Completed, signed off Medicare Important Message given?   (If response is "NO", the following Medicare IM given date fields will be blank) Date Medicare IM given:   Medicare IM given by:   Date Additional Medicare IM given:   Additional Medicare IM given by:    Discharge Disposition:  SKILLED NURSING FACILITY  Per UR Regulation:    If discussed at Long Length of Stay Meetings, dates discussed:    Comments:  12/24/2013 1145 NCM spoke to pt and plan is dc to SNF. Has RW, 3n1 and cane at home. CSW following for SNF-rehab. Isidoro DonningAlesia Laurine Kuyper RN CCM Case Mgmt phone [phone]

## 2013-12-24 NOTE — Progress Notes (Signed)
Physical Therapy Treatment Patient Details Name: [name] PerchesBarbara [name] MRN: [id number] DOB: 1928-11-26 Today's Date: 12/24/2013    History of Present Illness L THR anterior    PT Comments      Follow Up Recommendations  SNF     Equipment Recommendations  None recommended by PT    Recommendations for Other Services OT consult     Precautions / Restrictions Precautions Precautions: Fall Restrictions Weight Bearing Restrictions: No Other Position/Activity Restrictions: WBAT    Mobility  Bed Mobility Overal bed mobility: Needs Assistance Bed Mobility: Supine to Sit;Sit to Supine     Supine to sit: Min assist;Mod assist Sit to supine: Min assist;Mod assist   General bed mobility comments: cues for sequence and use of R LE to self assist  Transfers Overall transfer level: Needs assistance Equipment used: Rolling walker (2 wheeled) Transfers: Sit to/from Stand Sit to Stand: Min assist         General transfer comment: cues for LE management and use of UEs to self assist  Ambulation/Gait Ambulation/Gait assistance: Min assist Ambulation Distance (Feet): 65 Feet (twice) Assistive device: Rolling walker (2 wheeled) Gait Pattern/deviations: Step-to pattern;Step-through pattern;Decreased step length - right;Decreased step length - left;Shuffle;Trunk flexed     General Gait Details: cues for posture, sequence and position from RW - ltd by onset dizziness   Stairs            Wheelchair Mobility    Modified Rankin (Stroke Patients Only)       Balance                                    Cognition Arousal/Alertness: Awake/alert Behavior During Therapy: WFL for tasks assessed/performed Overall Cognitive Status: Within Functional Limits for tasks assessed                      Exercises      General Comments        Pertinent Vitals/Pain Pain Assessment: 0-10 Pain Score: 2  Pain Location: L hip Pain Descriptors / Indicators:  Sore Pain Intervention(s): Limited activity within patient's tolerance;Monitored during session;Premedicated before session;Ice applied    Home Living                      Prior Function            PT Goals (current goals can now be found in the care plan section) Acute Rehab PT Goals Patient Stated Goal: Rehab and home to resume previous lifestyle with decreased pain PT Goal Formulation: With patient Time For Goal Achievement: 12/30/13 Potential to Achieve Goals: Good Progress towards PT goals: Progressing toward goals    Frequency  7X/week    PT Plan Current plan remains appropriate    Co-evaluation             End of Session Equipment Utilized During Treatment: Gait belt Activity Tolerance: Patient tolerated treatment well Patient left: in bed;with call bell/phone within reach;with family/visitor present     Time: 4098-11911555-1613 PT Time Calculation (min): 18 min  Charges:  $Gait Training: 8-22 mins                    G Codes:      Min Collymore 12/24/2013, 4:48 PM

## 2013-12-25 LAB — CBC
HCT: 27 % — ABNORMAL LOW (ref 36.0–46.0)
Hemoglobin: 8.8 g/dL — ABNORMAL LOW (ref 12.0–15.0)
MCH: 29.4 pg (ref 26.0–34.0)
MCHC: 32.6 g/dL (ref 30.0–36.0)
MCV: 90.3 fL (ref 78.0–100.0)
Platelets: 163 10*3/uL (ref 150–400)
RBC: 2.99 MIL/uL — ABNORMAL LOW (ref 3.87–5.11)
RDW: 19.8 % — ABNORMAL HIGH (ref 11.5–15.5)
WBC: 10.8 10*3/uL — ABNORMAL HIGH (ref 4.0–10.5)

## 2013-12-25 LAB — TYPE AND SCREEN
ABO/RH(D): A NEG
Antibody Screen: NEGATIVE
Unit division: 0

## 2013-12-25 NOTE — Progress Notes (Signed)
Physical Therapy Treatment Patient Details Name: [name] PerchesBarbara [name] MRN: [id number] DOB: 27-Dec-1928 Today's Date: 12/25/2013    History of Present Illness L THR anterior    PT Comments    Pt ltd this pm by fatigue with ambulation.  BP 78/47 with SaO2 100% and HR 93.  BP to 97/48 with sitting.  RN aware.  Follow Up Recommendations  SNF     Equipment Recommendations  None recommended by PT    Recommendations for Other Services OT consult     Precautions / Restrictions Precautions Precautions: Fall Restrictions Weight Bearing Restrictions: No LLE Weight Bearing: Weight bearing as tolerated    Mobility  Bed Mobility Overal bed mobility: Needs Assistance Bed Mobility: Sit to Supine       Sit to supine: Min assist      Transfers Overall transfer level: Needs assistance Equipment used: Rolling walker (2 wheeled) Transfers: Sit to/from Stand Sit to Stand: Min assist         General transfer comment: cues for LE management and use of UEs to self assist  Ambulation/Gait Ambulation/Gait assistance: Min assist Ambulation Distance (Feet): 54 Feet Assistive device: Rolling walker (2 wheeled) Gait Pattern/deviations: Step-to pattern;Step-through pattern;Decreased step length - right;Decreased step length - left;Shuffle     General Gait Details: cues for posture, sequence and position from RW - ltd by fatigue    Stairs            Wheelchair Mobility    Modified Rankin (Stroke Patients Only)       Balance                                    Cognition Arousal/Alertness: Awake/alert Behavior During Therapy: WFL for tasks assessed/performed Overall Cognitive Status: Within Functional Limits for tasks assessed                      Exercises      General Comments        Pertinent Vitals/Pain Pain Assessment: 0-10 Pain Score: 3  Pain Location: L hip Pain Descriptors / Indicators: Sore Pain Intervention(s): Limited activity  within patient's tolerance;Monitored during session;Premedicated before session;Ice applied    Home Living                      Prior Function            PT Goals (current goals can now be found in the care plan section) Acute Rehab PT Goals Patient Stated Goal: Rehab and home to resume previous lifestyle with decreased pain PT Goal Formulation: With patient Time For Goal Achievement: 12/30/13 Potential to Achieve Goals: Good Progress towards PT goals: Progressing toward goals    Frequency  7X/week    PT Plan Current plan remains appropriate    Co-evaluation             End of Session Equipment Utilized During Treatment: Gait belt Activity Tolerance: Patient tolerated treatment well Patient left: in bed;with call bell/phone within reach;with family/visitor present     Time: 1431-1453 PT Time Calculation (min): 22 min  Charges:  $Gait Training: 8-22 mins                    G Codes:      [name] 12/25/2013, 3:36 PM

## 2013-12-25 NOTE — Progress Notes (Signed)
   Subjective:  Patient c/o RLE pain shooting down to ankle  Objective:   VITALS:   Filed Vitals:   12/24/13 1600 12/24/13 1745 12/24/13 2200 12/25/13 0515  BP:  111/55 139/55 131/40  Pulse:  90 99 98  Temp:  98.2 F (36.8 C) 99.6 F (37.6 C) 98.4 F (36.9 C)  TempSrc:  Oral Oral Oral  Resp: 16 18 20 20   Height:      Weight:      SpO2: 100% 100% 100% 98%    Dressing c/d/i   Lab Results  Component Value Date   WBC 10.8* 12/25/2013   HGB 8.8* 12/25/2013   HCT 27.0* 12/25/2013   MCV 90.3 12/25/2013   PLT 163 12/25/2013     Assessment/Plan:  2 Days Post-Op   - Expected postop acute blood loss anemia - will monitor for symptoms; appropriate response to transfusion, asymptomatic - Up with PT/OT - SNF - DVT ppx - SCDs, ambulation, asa - WBAT left lower extremity - Pain control - Discharge planning - FL2 signed in chart - RLE pain likely radicular pain  Cheral AlmasXu, Naiping Michael 12/25/2013, 6:58 AM [phone] [phone]

## 2013-12-25 NOTE — Progress Notes (Deleted)
MD said patient should stay in bed today d/t hematoma to hip. PT aware.

## 2013-12-25 NOTE — Progress Notes (Signed)
Physical Therapy Treatment Patient Details Name: [name] PerchesBarbara [name] MRN: [id number] DOB: 06/10/1928 Today's Date: 12/25/2013    History of Present Illness L THR anterior    PT Comments      Follow Up Recommendations  SNF     Equipment Recommendations  None recommended by PT    Recommendations for Other Services OT consult     Precautions / Restrictions Precautions Precautions: Fall Restrictions Weight Bearing Restrictions: No LLE Weight Bearing: Weight bearing as tolerated    Mobility  Bed Mobility               General bed mobility comments: in chair on arrival to room - states bed hurts her back  Transfers Overall transfer level: Needs assistance Equipment used: Rolling walker (2 wheeled) Transfers: Sit to/from Stand Sit to Stand: Min guard         General transfer comment: cues for LE management and use of UEs to self assist  Ambulation/Gait Ambulation/Gait assistance: Min assist Ambulation Distance (Feet): 68 Feet (twice) Assistive device: Rolling walker (2 wheeled) Gait Pattern/deviations: Step-to pattern;Step-through pattern;Decreased step length - right;Decreased step length - left;Shuffle     General Gait Details: cues for posture, sequence and position from RW - ltd by onset dizziness   Stairs            Wheelchair Mobility    Modified Rankin (Stroke Patients Only)       Balance                                    Cognition Arousal/Alertness: Awake/alert Behavior During Therapy: WFL for tasks assessed/performed Overall Cognitive Status: Within Functional Limits for tasks assessed                      Exercises Total Joint Exercises Ankle Circles/Pumps: AROM;Both;15 reps;Supine Quad Sets: AROM;Both;10 reps;Supine Gluteal Sets: AROM;Both;10 reps;Supine Heel Slides: AAROM;Left;20 reps;Supine Hip ABduction/ADduction: AAROM;Left;Supine;20 reps    General Comments        Pertinent Vitals/Pain Pain  Assessment: 0-10 Pain Score: 3  Pain Location: L hip Pain Descriptors / Indicators: Sore Pain Intervention(s): Limited activity within patient's tolerance;Monitored during session;Premedicated before session;Ice applied    Home Living                      Prior Function            PT Goals (current goals can now be found in the care plan section) Acute Rehab PT Goals Patient Stated Goal: Rehab and home to resume previous lifestyle with decreased pain PT Goal Formulation: With patient Time For Goal Achievement: 12/30/13 Potential to Achieve Goals: Good Progress towards PT goals: Progressing toward goals    Frequency  7X/week    PT Plan Current plan remains appropriate    Co-evaluation             End of Session Equipment Utilized During Treatment: Gait belt Activity Tolerance: Patient tolerated treatment well Patient left: in chair;with call bell/phone within reach     Time: 0937-1015 PT Time Calculation (min): 38 min  Charges:  $Gait Training: 8-22 mins $Therapeutic Exercise: 8-22 mins                    G Codes:      [name] 12/25/2013, 12:39 PM

## 2013-12-26 ENCOUNTER — Other Ambulatory Visit: Payer: Self-pay | Admitting: *Deleted

## 2013-12-26 ENCOUNTER — Encounter (HOSPITAL_COMMUNITY): Payer: Self-pay | Admitting: Orthopaedic Surgery

## 2013-12-26 LAB — CBC
HCT: 25.2 % — ABNORMAL LOW (ref 36.0–46.0)
Hemoglobin: 8.3 g/dL — ABNORMAL LOW (ref 12.0–15.0)
MCH: 29.1 pg (ref 26.0–34.0)
MCHC: 32.9 g/dL (ref 30.0–36.0)
MCV: 88.4 fL (ref 78.0–100.0)
Platelets: 179 10*3/uL (ref 150–400)
RBC: 2.85 MIL/uL — ABNORMAL LOW (ref 3.87–5.11)
RDW: 19.4 % — ABNORMAL HIGH (ref 11.5–15.5)
WBC: 8.9 10*3/uL (ref 4.0–10.5)

## 2013-12-26 MED ORDER — ASPIRIN 325 MG PO TBEC: 325.0000 mg | DELAYED_RELEASE_TABLET | Freq: Two times a day (BID) | ORAL | Status: DC

## 2013-12-26 MED ORDER — FERROUS SULFATE 325 (65 FE) MG PO TABS: 325.0000 mg | ORAL_TABLET | Freq: Three times a day (TID) | ORAL | Status: AC

## 2013-12-26 MED ORDER — DSS 100 MG PO CAPS: 100.0000 mg | ORAL_CAPSULE | Freq: Two times a day (BID) | ORAL | Status: DC | PRN

## 2013-12-26 MED ORDER — OXYCODONE-ACETAMINOPHEN 5-325 MG PO TABS: 1.0000 | ORAL_TABLET | ORAL | Status: DC | PRN

## 2013-12-26 MED ORDER — TRAMADOL HCL 50 MG PO TABS: 100.0000 mg | ORAL_TABLET | Freq: Four times a day (QID) | ORAL | Status: DC | PRN

## 2013-12-26 MED ORDER — TRAMADOL HCL 50 MG PO TABS
100.0000 mg | ORAL_TABLET | Freq: Four times a day (QID) | ORAL | Status: DC | PRN
  Administered 2013-12-26: 100 mg via ORAL
  Filled 2013-12-26: qty 2

## 2013-12-26 MED ORDER — FLEET ENEMA 7-19 GM/118ML RE ENEM: 1.0000 | ENEMA | Freq: Every day | RECTAL | Status: DC | PRN

## 2013-12-26 NOTE — Discharge Summary (Signed)
Patient ID: [name] MRN: [id number] DOB/AGE: [birth year] 78 y.o.  Admit date: 12/23/2013 Discharge date: 12/26/2013  Admission Diagnoses:  Principal Problem:   Arthritis of left hip Active Problems:   Status post total replacement of left hip   Discharge Diagnoses:  Same  Past Medical History  Diagnosis Date  . Hypertension   . Pneumonia   . Arthritis     left hip  . Colon polyps     type not known, year(s) of occurrence not defined.   . Acute duodenal ulcer with hemorrhage 01/18/2013  . Acute gastric ulcers with hemorrhage 01/18/2013  . Aortic valve replaced   . Nocturia   . Anemia   . History of transfusion     Surgeries: Procedure(s): TOTAL HIP ARTHROPLASTY ANTERIOR APPROACH, LEFT on 12/23/2013   Consultants:    Discharged Condition: Improved  Hospital Course: [name] is an 80 y.o. female who was admitted 12/23/2013 for operative treatment ofArthritis of left hip. Patient has severe unremitting pain that affects sleep, daily activities, and work/hobbies. After pre-op clearance the patient was taken to the operating room on 12/23/2013 and underwent  Procedure(s): TOTAL HIP ARTHROPLASTY ANTERIOR APPROACH, LEFT.    Patient was given perioperative antibiotics: Anti-infectives   Start     Dose/Rate Route Frequency Ordered Stop   12/23/13 1600  clindamycin (CLEOCIN) IVPB 600 mg     600 mg 100 mL/hr over 30 Minutes Intravenous Every 6 hours 12/23/13 1323 12/23/13 2218   12/23/13 0749  clindamycin (CLEOCIN) IVPB 900 mg     900 mg 100 mL/hr over 30 Minutes Intravenous On call to O.R. 12/23/13 0749 12/23/13 1022       Patient was given sequential compression devices, early ambulation, and chemoprophylaxis to prevent DVT.  Patient benefited maximally from hospital stay and there were no complications.    Recent vital signs: Patient Vitals for the past 24 hrs:  BP Temp Temp src Pulse Resp SpO2  12/26/13 0500 131/52 mmHg 99.3 F (37.4 C) Oral 98 18 97  %  12/25/13 2138 129/46 mmHg 99.8 F (37.7 C) Oral 102 20 97 %  12/25/13 1536 141/53 mmHg 99.7 F (37.6 C) Oral 98 20 100 %     Recent laboratory studies:  Recent Labs  12/24/13 0430  12/25/13 0519 12/26/13 0525  WBC 6.8  --  10.8* 8.9  HGB 8.2*  < > 8.8* 8.3*  HCT 25.5*  < > 27.0* 25.2*  PLT 178  --  163 179  NA 140  --   --   --   K 4.5  --   --   --   CL 106  --   --   --   CO2 24  --   --   --   BUN 18  --   --   --   CREATININE 1.01  --   --   --   GLUCOSE 109*  --   --   --   CALCIUM 8.4  --   --   --   < > = values in this interval not displayed.   Discharge Medications:     Medication List    STOP taking these medications       acetaminophen 500 MG tablet  Commonly known as:  TYLENOL     ferrous sulfate 324 (65 FE) MG Tbec  Replaced by:  ferrous sulfate 325 (65 FE) MG tablet      TAKE these medications  ASPERCREME EX  Apply 1 application topically once as needed (pain.).     aspirin 325 MG EC tablet  Take 1 tablet (325 mg total) by mouth 2 (two) times daily after a meal.     calcium carbonate 500 MG chewable tablet  Commonly known as:  TUMS - dosed in mg elemental calcium  Chew 1 tablet by mouth as needed for indigestion.     DSS 100 MG Caps  Take 100 mg by mouth 2 (two) times daily as needed for mild constipation.     ferrous sulfate 325 (65 FE) MG tablet  Take 1 tablet (325 mg total) by mouth 3 (three) times daily after meals.     guaiFENesin 600 MG 12 hr tablet  Commonly known as:  MUCINEX  Take 600 mg by mouth 2 (two) times daily as needed for cough or to loosen phlegm.     ICAPS PO  Take 1 tablet by mouth every morning.     Menthol (Topical Analgesic) 154 MG Pads  Apply 1 each topically once as needed (pain in knee.).     metoprolol tartrate 25 MG tablet  Commonly known as:  LOPRESSOR  Take 25 mg by mouth every morning.     NAPHCON-A OP  Apply 1-2 drops to eye once as needed (allergies.).     oxyCODONE-acetaminophen 5-325  MG per tablet  Commonly known as:  ROXICET  Take 1 tablet by mouth every 4 (four) hours as needed.     pantoprazole 40 MG tablet  Commonly known as:  PROTONIX  Take 40 mg by mouth every morning.     saccharomyces boulardii 250 MG capsule  Commonly known as:  FLORASTOR  Take 250 mg by mouth every morning.     SYSTANE BALANCE OP  Apply 1-2 drops to eye once as needed (dry eyes.).     Vitamin D3 2000 UNITS capsule  Take 2,000 Units by mouth every morning.        Diagnostic Studies: Dg Hip Complete Left  12/23/2013   CLINICAL DATA:  Left hip replacement  EXAM: LEFT HIP - COMPLETE 2+ VIEW  COMPARISON:  None.  FINDINGS: C-arm spot films show the acetabular and femoral components of the left hip replacement in good position. No complicating features are seen.  IMPRESSION: Left total hip replacement.   Electronically Signed   By: Dwyane DeePaul  Barry M.D.   On: 12/23/2013 11:26   Dg Pelvis Portable  12/23/2013   CLINICAL DATA:  Postop left hip arthroplasty.  EXAM: PORTABLE PELVIS 1-2 VIEWS  COMPARISON:  Earlier same day.  FINDINGS: Exam demonstrates evidence of patient's recent total left hip arthroplasty as the prosthesis appears intact and normally located. Minimal air in the soft tissues adjacent the left hip compatible with recent surgery. There are moderate osteoarthritic changes of the right hip. There is mild diffuse decreased bone mineralization present.  IMPRESSION: Evidence of patient's recent left total hip arthroplasty without complicating features.  Moderate osteoarthritis right hip.   Electronically Signed   By: Elberta Fortisaniel  Boyle M.D.   On: 12/23/2013 12:52   Dg Hip Portable 1 View Left  12/23/2013   CLINICAL DATA:  Left hip replacement.  EXAM: PORTABLE LEFT HIP - 1 VIEW  COMPARISON:  Postoperative view of the pelvis.  FINDINGS: Cross-table lateral view of the left hip demonstrates changes of left hip replacement. Normal alignment. No bony complicating feature.  IMPRESSION: Left hip  replacement.  No complicating feature.   Electronically Signed   By: Caryn BeeKevin  Dover M.D.   On: 12/23/2013 12:56   Dg C-arm 1-60 Min-no Report  12/23/2013   CLINICAL DATA: left anterior hip   C-ARM 1-60 MINUTES  Fluoroscopy was utilized by the requesting physician.  No radiographic  interpretation.     Disposition: to skilled nursing facility      Discharge Instructions   Call MD / Call 911    Complete by:  As directed   If you experience chest pain or shortness of breath, CALL 911 and be transported to the hospital emergency room.  If you develope a fever above 101 F, pus (white drainage) or increased drainage or redness at the wound, or calf pain, call your surgeon's office.     Constipation Prevention    Complete by:  As directed   Drink plenty of fluids.  Prune juice may be helpful.  You may use a stool softener, such as Colace (over the counter) 100 mg twice a day.  Use MiraLax (over the counter) for constipation as needed.     Diet - low sodium heart healthy    Complete by:  As directed      Discharge instructions    Complete by:  As directed   Increase activities as comfort allows. Full weight bearing on left hip and no hip precautions. Please leave the current hip dressing completely alone and do not touch it until her outpatient follow-up Can get current hip dressing wet in the shower.     Discharge patient    Complete by:  As directed      Increase activity slowly as tolerated    Complete by:  As directed            Follow-up Information   Follow up with Kathryne HitchBLACKMAN,Lydiann Bonifas Y, MD In 2 weeks.   Specialty:  Orthopedic Surgery   Contact information:   [address] [place] [address] [phone]        Signed: Kathryne HitchBLACKMAN,Zaynab Chipman Y 12/26/2013, 7:13 AM

## 2013-12-26 NOTE — Progress Notes (Signed)
[facility] [place] [facility]  [facility]

## 2013-12-26 NOTE — Progress Notes (Signed)
Subjective: 3 Days Post-Op Procedure(s) (LRB): TOTAL HIP ARTHROPLASTY ANTERIOR APPROACH, LEFT (Left) Patient reports pain as moderate.  Slept better last evening.  Asymptomatic acute blood loss anemia.  Objective: Vital signs in last 24 hours: Temp:  [99.3 F (37.4 C)-99.8 F (37.7 C)] 99.3 F (37.4 C) (10/19 0500) Pulse Rate:  [98-102] 98 (10/19 0500) Resp:  [18-20] 18 (10/19 0500) BP: (129-141)/(46-53) 131/52 mmHg (10/19 0500) SpO2:  [97 %-100 %] 97 % (10/19 0500)  Intake/Output from previous day: 10/18 0701 - 10/19 0700 In: 960 [P.O.:960] Out: -  Intake/Output this shift:     Recent Labs  12/24/13 0430 12/24/13 2033 12/25/13 0519 12/26/13 0525  HGB 8.2* 9.3* 8.8* 8.3*    Recent Labs  12/25/13 0519 12/26/13 0525  WBC 10.8* 8.9  RBC 2.99* 2.85*  HCT 27.0* 25.2*  PLT 163 179    Recent Labs  12/24/13 0430  NA 140  K 4.5  CL 106  CO2 24  BUN 18  CREATININE 1.01  GLUCOSE 109*  CALCIUM 8.4   No results found for this basename: LABPT, INR,  in the last 72 hours  Sensation intact distally Intact pulses distally Dorsiflexion/Plantar flexion intact Incision: dressing C/D/I  Assessment/Plan: 3 Days Post-Op Procedure(s) (LRB): TOTAL HIP ARTHROPLASTY ANTERIOR APPROACH, LEFT (Left) Discharge to SNF today.  Avaeh Ewer Y 12/26/2013, 7:09 AM

## 2013-12-26 NOTE — Progress Notes (Signed)
Order received for fleets prn after pt was disimpacted small amt hard brown stool.passed small amt dark blood,reminded pt not to strain.Will attempt another bm after possible enema to be given prn. Linward HeadlandBeverly, Raylie Maddison D

## 2013-12-26 NOTE — Telephone Encounter (Signed)
[facility] 

## 2013-12-26 NOTE — Progress Notes (Signed)
[facility]. Patient & daughter, Diane aware. Discharge packet is at the nurses station - RN, Danna aware. Patient's daughter, [place] MutaDiane to transport to SNF.   Clinical Social Work Department CLINICAL SOCIAL WORK PLACEMENT NOTE 12/26/2013  Patient:  [name] PerchesFARLOW,[name]  Account Number:  [id number] Admit date:  12/23/2013  Clinical Social Worker:  Orpah GreekKELLY FOLEY, LCSWA  Date/time:  12/24/2013 03:09 PM  Clinical Social Work is seeking post-discharge placement for this patient at the following level of care:   SKILLED NURSING   (*CSW will update this form in Epic as items are completed)   12/24/2013  Patient/family provided with [facility] Department of Clinical Social Work's list of facilities offering this level of care within the geographic area requested by the patient (or if unable, by the patient's family).  12/24/2013  Patient/family informed of their freedom to choose among providers that offer the needed level of care, that participate in Medicare, Medicaid or managed care program needed by the patient, have an available bed and are willing to accept the patient.  12/24/2013  Patient/family informed of [facility]' ownership interest in [facility], as well as of the fact that they are under no obligation to receive care at this facility.  PASARR submitted to EDS on 12/24/2013 PASARR number received on 12/24/2013  FL2 transmitted to all facilities in geographic area requested by pt/family on  12/24/2013 FL2 transmitted to all facilities within larger geographic area on   Patient informed that his/her managed care company has contracts with or will negotiate with  certain facilities, including the following:     Patient/family informed of bed offers received:  12/26/2013 [facility] Physician recommends and patient chooses bed at    [facility]  12/26/2013 Patient to be  transferred to facility by [facility] Patient and family notified of transfer on 12/26/2013 Name of family member notified:  patient's daughter, Diane at bedside  The following physician request were entered in Epic:   Additional Comments:   Lincoln MaxinKelly Hobart Marte, LCSW [facility] Clinical Social Worker cell #: [phone]

## 2013-12-27 ENCOUNTER — Non-Acute Institutional Stay (SKILLED_NURSING_FACILITY): Payer: Medicare Other | Admitting: Internal Medicine

## 2013-12-27 DIAGNOSIS — I10 Essential (primary) hypertension: Secondary | ICD-10-CM

## 2013-12-27 DIAGNOSIS — D62 Acute posthemorrhagic anemia: Secondary | ICD-10-CM

## 2013-12-27 DIAGNOSIS — M1612 Unilateral primary osteoarthritis, left hip: Secondary | ICD-10-CM

## 2013-12-27 DIAGNOSIS — K269 Duodenal ulcer, unspecified as acute or chronic, without hemorrhage or perforation: Secondary | ICD-10-CM

## 2013-12-27 DIAGNOSIS — M1990 Unspecified osteoarthritis, unspecified site: Secondary | ICD-10-CM

## 2013-12-28 DIAGNOSIS — K269 Duodenal ulcer, unspecified as acute or chronic, without hemorrhage or perforation: Secondary | ICD-10-CM | POA: Insufficient documentation

## 2013-12-28 DIAGNOSIS — I10 Essential (primary) hypertension: Secondary | ICD-10-CM | POA: Insufficient documentation

## 2013-12-28 DIAGNOSIS — D62 Acute posthemorrhagic anemia: Secondary | ICD-10-CM | POA: Insufficient documentation

## 2013-12-28 NOTE — Progress Notes (Signed)
HISTORY & PHYSICAL  DATE: 12/27/2013   FACILITY: [facility]  LEVEL OF CARE: SNF (31)  ALLERGIES:  Allergies  Allergen Reactions  . Latex Other (See Comments)    Pt. Stated that when she wears stockings or socks with certain elastics that her skin turns red.  Marland Kitchen. Penicillins Rash    CHIEF COMPLAINT:  Manage left hip osteoarthritis, acute blood loss anemia and hypertension  HISTORY OF PRESENT ILLNESS: Patient is an 80 year old Caucasian female.  HIP OSTEOARTHRITIS: patient had advanced end stage OA of the hip with progressively worsening pain & dysfunction.  Pt failed non-surgical conservative management.  Therefore pt underwent total hip arthroplasty & tolerated the procedure well.  Pt denies hip pain currently.  Pt was admitted to this facility for short term rehabilitation. Complains of left lower extremity swelling.  ANEMIA: The anemia has been stable. The patient denies fatigue, melena or hematochezia. No complications from the medications currently being used.  HTN: Pt 's HTN remains stable.  Denies CP, sob, DOE, headaches, dizziness or visual disturbances.  No complications from the medications currently being used.  Last BP : 113/50.  PAST MEDICAL HISTORY :  Past Medical History  Diagnosis Date  . Hypertension   . Pneumonia   . Arthritis     left hip  . Colon polyps     type not known, year(s) of occurrence not defined.   . Acute duodenal ulcer with hemorrhage 01/18/2013  . Acute gastric ulcers with hemorrhage 01/18/2013  . Aortic valve replaced   . Nocturia   . Anemia   . History of transfusion     PAST SURGICAL HISTORY: Past Surgical History  Procedure Laterality Date  . Tissue aortic valve replacement  Nov 18 2012    at [facility]  . Dilation and curettage of uterus      x 2.  . Cataract extraction Bilateral   . Esophagogastroduodenoscopy N/A 01/18/2013    Procedure: ESOPHAGOGASTRODUODENOSCOPY (EGD);  Surgeon: Iva Booparl E  Gessner, MD;  [facility];  Service: Endoscopy;  Laterality: N/A;  . Total hip arthroplasty Left 12/23/2013    Procedure: TOTAL HIP ARTHROPLASTY ANTERIOR APPROACH, LEFT;  Surgeon: Kathryne Hitchhristopher Y Blackman, MD;  Location: [facility] ORS;  Service: Orthopedics;  Laterality: Left;    SOCIAL HISTORY:  reports that she has never smoked. She does not have any smokeless tobacco history on file. She reports that she does not drink alcohol or use illicit drugs.  FAMILY HISTORY:  Family History  Problem Relation Age of Onset  . Colon cancer Neg Hx   . Breast cancer Neg Hx   . Throat cancer Neg Hx   . Stomach cancer Neg Hx     CURRENT MEDICATIONS: Reviewed per MAR/see medication list  REVIEW OF SYSTEMS:  See HPI otherwise 14 point ROS is negative.  PHYSICAL EXAMINATION  VS:  See VS section  GENERAL: no acute distress, normal body habitus EYES: conjunctivae normal, sclerae normal, normal eye lids MOUTH/THROAT: lips without lesions,no lesions in the mouth,tongue is without lesions,uvula elevates in midline NECK: supple, trachea midline, no neck masses, no thyroid tenderness, no thyromegaly LYMPHATICS: no LAN in the neck, no supraclavicular LAN RESPIRATORY: breathing is even & unlabored, BS CTAB CARDIAC: RRR, no murmur,no extra heart sounds, left lower extremity +2 edema GI:  ABDOMEN: abdomen soft, normal BS, no masses, no tenderness  LIVER/SPLEEN: no hepatomegaly, no splenomegaly MUSCULOSKELETAL: HEAD: normal to inspection  EXTREMITIES: LEFT UPPER EXTREMITY: full range of  motion, normal strength & tone RIGHT UPPER EXTREMITY:  full range of motion, normal strength & tone LEFT LOWER EXTREMITY:   range of motion not tested due to surgery, normal strength & tone RIGHT LOWER EXTREMITY:  full range of motion, normal strength & tone PSYCHIATRIC: the patient is alert & oriented to person, affect & behavior appropriate  LABS/RADIOLOGY:  Labs reviewed: Basic Metabolic Panel:  Recent Labs   01/19/13 0529 12/14/13 1220 12/24/13 0430  NA 139 137 140  K 3.5 4.5 4.5  CL 106 99 106  CO2 22 24 24   GLUCOSE 94 81 109*  BUN 14 32* 18  CREATININE 1.04 1.19* 1.01  CALCIUM 8.5 9.8 8.4   Liver Function Tests:  Recent Labs  01/17/13 1431  AST 28  ALT 14  ALKPHOS 42  BILITOT 0.3  PROT 6.9  ALBUMIN 3.1*    Recent Labs  01/17/13 1431  LIPASE 35   CBC:  Recent Labs  01/17/13 1431  12/24/13 0430 12/24/13 2033 12/25/13 0519 12/26/13 0525  WBC 9.0  < > 6.8  --  10.8* 8.9  NEUTROABS 8.1*  --   --   --   --   --   HGB 8.5*  < > 8.2* 9.3* 8.8* 8.3*  HCT 26.1*  < > 25.5* 28.3* 27.0* 25.2*  MCV 87.0  < > 92.7  --  90.3 88.4  PLT 376  < > 178  --  163 179  < > = values in this interval not displayed.   LEFT HIP - COMPLETE 2+ VIEW   COMPARISON:  None.   FINDINGS: C-arm spot films show the acetabular and femoral components of the left hip replacement in good position. No complicating features are seen.   IMPRESSION: Left total hip replacement. PORTABLE LEFT HIP - 1 VIEW   COMPARISON:  Postoperative view of the pelvis.   FINDINGS: Cross-table lateral view of the left hip demonstrates changes of left hip replacement. Normal alignment. No bony complicating feature.   IMPRESSION: Left hip replacement.  No complicating feature PORTABLE PELVIS 1-2 VIEWS   COMPARISON:  Earlier same day.   FINDINGS: Exam demonstrates evidence of patient's recent total left hip arthroplasty as the prosthesis appears intact and normally located. Minimal air in the soft tissues adjacent the left hip compatible with recent surgery. There are moderate osteoarthritic changes of the right hip. There is mild diffuse decreased bone mineralization present.   IMPRESSION: Evidence of patient's recent left total hip arthroplasty without complicating features.   Moderate osteoarthritis right hip.     ASSESSMENT/PLAN:  Left hip osteoarthritis-status post left total hip  arthroplasty. Continue rehabilitation. Hypertension-well-controlled Acute blood loss anemia-check hemoglobin. Continue iron. History of duodenal ulcer-continue Protonix Check CBC  I have reviewed patient's medical records received at admission/from hospitalization.  CPT CODE: [id number]  Angela CoxGayani Y Kelsie Zaborowski, MD [facility] [phone]

## 2014-01-05 ENCOUNTER — Ambulatory Visit (HOSPITAL_COMMUNITY)
Admission: RE | Admit: 2014-01-05 | Discharge: 2014-01-05 | Disposition: A | Discharge status: Home / Self Care | Payer: Medicare Other | Source: Ambulatory Visit | Attending: Orthopaedic Surgery | Admitting: Orthopaedic Surgery

## 2014-01-05 ENCOUNTER — Other Ambulatory Visit (HOSPITAL_COMMUNITY): Payer: Self-pay | Admitting: Orthopaedic Surgery

## 2014-01-05 DIAGNOSIS — M7989 Other specified soft tissue disorders: Secondary | ICD-10-CM | POA: Diagnosis present

## 2014-01-05 DIAGNOSIS — M25562 Pain in left knee: Secondary | ICD-10-CM

## 2014-01-05 DIAGNOSIS — M79609 Pain in unspecified limb: Secondary | ICD-10-CM

## 2014-01-05 NOTE — Progress Notes (Signed)
*  Preliminary Results* Left lower extremity venous duplex completed. Left lower extremity is negative for deep vein thrombosis. There is no evidence of left Baker's cyst.  01/05/2014 4:53 PM  Gertie FeyMichelle Ami Mally, RVT, RDCS, RDMS

## 2014-01-06 ENCOUNTER — Encounter: Payer: Self-pay | Admitting: Adult Health

## 2014-01-06 ENCOUNTER — Non-Acute Institutional Stay (SKILLED_NURSING_FACILITY): Payer: Medicare Other | Admitting: Adult Health

## 2014-01-06 DIAGNOSIS — D62 Acute posthemorrhagic anemia: Secondary | ICD-10-CM

## 2014-01-06 DIAGNOSIS — L03116 Cellulitis of left lower limb: Secondary | ICD-10-CM

## 2014-01-06 DIAGNOSIS — I10 Essential (primary) hypertension: Secondary | ICD-10-CM

## 2014-01-06 DIAGNOSIS — Z96649 Presence of unspecified artificial hip joint: Secondary | ICD-10-CM

## 2014-01-06 DIAGNOSIS — M1990 Unspecified osteoarthritis, unspecified site: Secondary | ICD-10-CM

## 2014-01-06 DIAGNOSIS — Z96642 Presence of left artificial hip joint: Secondary | ICD-10-CM

## 2014-01-06 DIAGNOSIS — M1612 Unilateral primary osteoarthritis, left hip: Secondary | ICD-10-CM

## 2014-01-06 DIAGNOSIS — K264 Chronic or unspecified duodenal ulcer with hemorrhage: Secondary | ICD-10-CM

## 2014-01-06 NOTE — Progress Notes (Signed)
Patient ID: [name] PerchesBarbara Lavis, female   DOB: 02-15-1929, 80 y.o.   MRN: [id number]              PROGRESS NOTE  DATE: 01/06/2014   FACILITY: [facility]  LEVEL OF CARE: SNF (31)   Discharge Notes  HISTORY OF PRESENT ILLNESS:   This is an 80 year old female who is for discharge home with Home health PT and OT. [facility] with Arthritis S/P Left total hip replacement.  Patient was admitted to this facility for short-term rehabilitation after the patient's recent hospitalization.  Patient has completed SNF rehabilitation and therapy has cleared the patient for discharge.  Reassessment of ongoing problem(s):  HTN: Pt 's HTN remains stable.  Denies CP, sob, DOE, pedal edema, headaches, dizziness or visual disturbances.  No complications from the medications currently being used.  Last BP : 133/51  ANEMIA: The anemia has been stable. The patient denies fatigue, melena or hematochezia. No complications from the medications currently being used. 10/15 hgb 9.7  HISTORY OF GI Bleed : Patient does not complain of abdominal pain, hematochezia nor hematemesis. No side effects noted from Protonix.  Past Medical History  Diagnosis Date  . Hypertension   . Pneumonia   . Arthritis     left hip  . Colon polyps     type not known, year(s) of occurrence not defined.   . Acute duodenal ulcer with hemorrhage 01/18/2013  . Acute gastric ulcers with hemorrhage 01/18/2013  . Aortic valve replaced   . Nocturia   . Anemia   . History of transfusion      Reviewed.  No changes/see problem list  CURRENT MEDICATIONS: Reviewed per MAR/see medication list    Allergies  Allergen Reactions  . Latex Other (See Comments)    Pt. Stated that when she wears stockings or socks with certain elastics that her skin turns red.  Marland Kitchen. Penicillins Rash     REVIEW OF SYSTEMS:  GENERAL: no change in appetite, no fatigue, no weight changes,  no fever, chills or weakness RESPIRATORY: no cough, SOB, DOE, wheezing, hemoptysis CARDIAC: no chest pain, or palpitations, + edema GI: no abdominal pain, diarrhea, constipation, heart burn, nausea or vomiting  PHYSICAL EXAMINATION  GENERAL: no acute distress, normal body habitus SKIN:  Left hip surgical site is erythematous, hard EYES: conjunctivae normal, sclerae normal, normal eye lids NECK: supple, trachea midline, no neck masses, no thyroid tenderness, no thyromegaly LYMPHATICS: no LAN in the neck, no supraclavicular LAN RESPIRATORY: breathing is even & unlabored, BS CTAB CARDIAC: RRR, no murmur,no extra heart sounds, LLE edema 2+ GI: abdomen soft, normal BS, no masses, no tenderness, no hepatomegaly, no splenomegaly EXTREMITIES: able to move all 4 extremities; ambulates with rolling walker PSYCHIATRIC: the patient is alert & oriented to person, affect & behavior appropriate  LABS/RADIOLOGY: 01/05/14  WBC 7.8 hemoglobin 9.7 hematocrit 31.8 MCV 96.4 12/27/13  WBC 6.3 hemoglobin 8.5 hematocrit 27.5 MCV 93.4 Labs reviewed: Basic Metabolic Panel:  Recent Labs  36/64/4010/02/20 0529 12/14/13 1220 12/24/13 0430  NA 139 137 140  K 3.5 4.5 4.5  CL 106 99 106  CO2 22 24 24   GLUCOSE 94 81 109*  BUN 14 32* 18  CREATININE 1.04 1.19* 1.01  CALCIUM 8.5 9.8 8.4   Liver Function Tests:  Recent Labs  01/17/13 1431  AST 28  ALT 14  ALKPHOS 42  BILITOT 0.3  PROT 6.9  ALBUMIN 3.1*  Recent Labs  01/17/13 1431  LIPASE 35   CBC:  Recent Labs  01/17/13 1431  12/24/13 0430 12/24/13 2033 12/25/13 0519 12/26/13 0525  WBC 9.0  < > 6.8  --  10.8* 8.9  NEUTROABS 8.1*  --   --   --   --   --   HGB 8.5*  < > 8.2* 9.3* 8.8* 8.3*  HCT 26.1*  < > 25.5* 28.3* 27.0* 25.2*  MCV 87.0  < > 92.7  --  90.3 88.4  PLT 376  < > 178  --  163 179  < > = values in this interval not displayed.  Dg Hip Complete Left  12/23/2013   CLINICAL DATA:  Left hip replacement  EXAM: LEFT HIP - COMPLETE  2+ VIEW  COMPARISON:  None.  FINDINGS: C-arm spot films show the acetabular and femoral components of the left hip replacement in good position. No complicating features are seen.  IMPRESSION: Left total hip replacement.   Electronically Signed   By: Dwyane DeePaul  Barry M.D.   On: 12/23/2013 11:26   Dg Pelvis Portable  12/23/2013   CLINICAL DATA:  Postop left hip arthroplasty.  EXAM: PORTABLE PELVIS 1-2 VIEWS  COMPARISON:  Earlier same day.  FINDINGS: Exam demonstrates evidence of patient's recent total left hip arthroplasty as the prosthesis appears intact and normally located. Minimal air in the soft tissues adjacent the left hip compatible with recent surgery. There are moderate osteoarthritic changes of the right hip. There is mild diffuse decreased bone mineralization present.  IMPRESSION: Evidence of patient's recent left total hip arthroplasty without complicating features.  Moderate osteoarthritis right hip.   Electronically Signed   By: Elberta Fortisaniel  Boyle M.D.   On: 12/23/2013 12:52   Dg Hip Portable 1 View Left  12/23/2013   CLINICAL DATA:  Left hip replacement.  EXAM: PORTABLE LEFT HIP - 1 VIEW  COMPARISON:  Postoperative view of the pelvis.  FINDINGS: Cross-table lateral view of the left hip demonstrates changes of left hip replacement. Normal alignment. No bony complicating feature.  IMPRESSION: Left hip replacement.  No complicating feature.   Electronically Signed   By: Charlett NoseKevin  Dover M.D.   On: 12/23/2013 12:56   Dg C-arm 1-60 Min-no Report  12/29/2013   : Fluoroscopy was utilized by the requesting physician. No radiographic interpretation.   Electronically Signed   By: Leslye Peerad  Services   On: 12/29/2013 13:05    ASSESSMENT/PLAN:  Arthritis status post left total hip replacement - for home health PT and OT Cellulitis, left hip - recently started on doxycycline 10 days Hypertension - well controlled; continue Lopressor History of GI bleed - continue Protonix Anemia, acute blood loss - stable;  continue ferrous sulfate   I have filled out patient's discharge paperwork and written prescriptions.  Patient will receive home health PT and OT.  Total discharge time: Less than 30 minutes  Discharge time involved coordination of the discharge process with Child psychotherapistsocial worker, nursing staff and therapy department. Medical justification for home health services verified.  CPT CODE: [id number]  [facility]-VARGAS,Elonda Giuliano, NP [facility] [phone]

## 2014-01-09 ENCOUNTER — Other Ambulatory Visit (HOSPITAL_COMMUNITY): Payer: Self-pay | Admitting: Orthopaedic Surgery

## 2014-01-09 ENCOUNTER — Inpatient Hospital Stay (HOSPITAL_COMMUNITY): Payer: Medicare Other | Admitting: Certified Registered Nurse Anesthetist

## 2014-01-09 ENCOUNTER — Encounter (HOSPITAL_COMMUNITY): Payer: Self-pay | Admitting: *Deleted

## 2014-01-09 ENCOUNTER — Encounter (HOSPITAL_COMMUNITY)
Admission: RE | Disposition: A | Discharge status: Home / Self Care | Payer: Self-pay | Source: Ambulatory Visit | Attending: Orthopaedic Surgery

## 2014-01-09 ENCOUNTER — Inpatient Hospital Stay (HOSPITAL_COMMUNITY)
Admission: RE | Admit: 2014-01-09 | Discharge: 2014-01-11 | DRG: 857 | Disposition: A | Discharge status: Home / Self Care | Payer: Medicare Other | Source: Ambulatory Visit | Attending: Orthopaedic Surgery | Admitting: Orthopaedic Surgery

## 2014-01-09 DIAGNOSIS — D649 Anemia, unspecified: Secondary | ICD-10-CM | POA: Diagnosis present

## 2014-01-09 DIAGNOSIS — Z9841 Cataract extraction status, right eye: Secondary | ICD-10-CM | POA: Diagnosis not present

## 2014-01-09 DIAGNOSIS — T8140XA Infection following a procedure, unspecified, initial encounter: Secondary | ICD-10-CM | POA: Diagnosis present

## 2014-01-09 DIAGNOSIS — Z7982 Long term (current) use of aspirin: Secondary | ICD-10-CM

## 2014-01-09 DIAGNOSIS — M1611 Unilateral primary osteoarthritis, right hip: Secondary | ICD-10-CM | POA: Diagnosis present

## 2014-01-09 DIAGNOSIS — T814XXA Infection following a procedure, initial encounter: Principal | ICD-10-CM | POA: Diagnosis present

## 2014-01-09 DIAGNOSIS — IMO0001 Reserved for inherently not codable concepts without codable children: Secondary | ICD-10-CM

## 2014-01-09 DIAGNOSIS — I10 Essential (primary) hypertension: Secondary | ICD-10-CM | POA: Diagnosis present

## 2014-01-09 DIAGNOSIS — Y838 Other surgical procedures as the cause of abnormal reaction of the patient, or of later complication, without mention of misadventure at the time of the procedure: Secondary | ICD-10-CM | POA: Diagnosis present

## 2014-01-09 DIAGNOSIS — Z880 Allergy status to penicillin: Secondary | ICD-10-CM | POA: Diagnosis not present

## 2014-01-09 DIAGNOSIS — Z86010 Personal history of colonic polyps: Secondary | ICD-10-CM

## 2014-01-09 DIAGNOSIS — Z952 Presence of prosthetic heart valve: Secondary | ICD-10-CM

## 2014-01-09 DIAGNOSIS — T8452XA Infection and inflammatory reaction due to internal left hip prosthesis, initial encounter: Secondary | ICD-10-CM | POA: Diagnosis present

## 2014-01-09 DIAGNOSIS — Z9842 Cataract extraction status, left eye: Secondary | ICD-10-CM | POA: Diagnosis not present

## 2014-01-09 DIAGNOSIS — T8149XA Infection following a procedure, other surgical site, initial encounter: Secondary | ICD-10-CM

## 2014-01-09 HISTORY — PX: INCISION AND DRAINAGE HIP: SHX1801

## 2014-01-09 LAB — BASIC METABOLIC PANEL
Anion gap: 15 (ref 5–15)
BUN: 16 mg/dL (ref 6–23)
CO2: 25 mEq/L (ref 19–32)
Calcium: 9.7 mg/dL (ref 8.4–10.5)
Chloride: 102 mEq/L (ref 96–112)
Creatinine, Ser: 0.94 mg/dL (ref 0.50–1.10)
GFR calc Af Amer: 63 mL/min — ABNORMAL LOW (ref >90)
GFR calc non Af Amer: 55 mL/min — ABNORMAL LOW (ref >90)
Glucose, Bld: 99 mg/dL (ref 70–99)
Potassium: 4.9 mEq/L (ref 3.7–5.3)
Sodium: 142 mEq/L (ref 137–147)

## 2014-01-09 LAB — CBC
HCT: 33.4 % — ABNORMAL LOW (ref 36.0–46.0)
Hemoglobin: 10.6 g/dL — ABNORMAL LOW (ref 12.0–15.0)
MCH: 28.6 pg (ref 26.0–34.0)
MCHC: 31.7 g/dL (ref 30.0–36.0)
MCV: 90.3 fL (ref 78.0–100.0)
Platelets: 443 10*3/uL — ABNORMAL HIGH (ref 150–400)
RBC: 3.7 MIL/uL — ABNORMAL LOW (ref 3.87–5.11)
RDW: 17.1 % — ABNORMAL HIGH (ref 11.5–15.5)
WBC: 8.4 10*3/uL (ref 4.0–10.5)

## 2014-01-09 LAB — SEDIMENTATION RATE: Sed Rate: 39 mm/hr — ABNORMAL HIGH (ref 0–22)

## 2014-01-09 SURGERY — IRRIGATION AND DEBRIDEMENT HIP
Anesthesia: General | Laterality: Left

## 2014-01-09 MED ORDER — PROPOFOL 10 MG/ML IV BOLUS
INTRAVENOUS | Status: DC | PRN
  Administered 2014-01-09: 100 mg via INTRAVENOUS

## 2014-01-09 MED ORDER — VITAMIN D3 50 MCG (2000 UT) PO CAPS: 2000.0000 [IU] | ORAL_CAPSULE | ORAL | Status: DC

## 2014-01-09 MED ORDER — FENTANYL CITRATE 0.05 MG/ML IJ SOLN
INTRAMUSCULAR | Status: AC
  Filled 2014-01-09: qty 5

## 2014-01-09 MED ORDER — HYDROCODONE-ACETAMINOPHEN 5-325 MG PO TABS
1.0000 | ORAL_TABLET | ORAL | Status: DC | PRN
  Administered 2014-01-10 – 2014-01-11 (×2): 1 via ORAL
  Filled 2014-01-09 (×2): qty 1

## 2014-01-09 MED ORDER — VITAMIN C 500 MG PO TABS
500.0000 mg | ORAL_TABLET | Freq: Two times a day (BID) | ORAL | Status: DC
  Administered 2014-01-09 – 2014-01-11 (×4): 500 mg via ORAL
  Filled 2014-01-09 (×5): qty 1

## 2014-01-09 MED ORDER — SODIUM CHLORIDE 0.9 % IR SOLN
Status: DC | PRN
  Administered 2014-01-09: 4000 mL

## 2014-01-09 MED ORDER — ROCURONIUM BROMIDE 100 MG/10ML IV SOLN
INTRAVENOUS | Status: DC | PRN
  Administered 2014-01-09: 40 mg via INTRAVENOUS

## 2014-01-09 MED ORDER — ASPIRIN EC 325 MG PO TBEC
325.0000 mg | DELAYED_RELEASE_TABLET | Freq: Every day | ORAL | Status: DC
  Administered 2014-01-09 – 2014-01-11 (×3): 325 mg via ORAL
  Filled 2014-01-09 (×3): qty 1

## 2014-01-09 MED ORDER — METOCLOPRAMIDE HCL 10 MG PO TABS: 5.0000 mg | ORAL_TABLET | Freq: Three times a day (TID) | ORAL | Status: DC | PRN

## 2014-01-09 MED ORDER — METOPROLOL TARTRATE 25 MG PO TABS
25.0000 mg | ORAL_TABLET | Freq: Every day | ORAL | Status: DC
Start: 2014-01-10 — End: 2014-01-11
  Administered 2014-01-10 – 2014-01-11 (×2): 25 mg via ORAL
  Filled 2014-01-09 (×2): qty 1

## 2014-01-09 MED ORDER — CALCIUM CARBONATE ANTACID 500 MG PO CHEW
1.0000 | CHEWABLE_TABLET | ORAL | Status: DC | PRN
  Filled 2014-01-09: qty 1

## 2014-01-09 MED ORDER — MIDAZOLAM HCL 2 MG/2ML IJ SOLN
INTRAMUSCULAR | Status: AC
  Filled 2014-01-09: qty 2

## 2014-01-09 MED ORDER — DIPHENHYDRAMINE HCL 12.5 MG/5ML PO ELIX
12.5000 mg | ORAL_SOLUTION | ORAL | Status: DC | PRN
Start: 2014-01-09 — End: 2014-01-11

## 2014-01-09 MED ORDER — OXYCODONE-ACETAMINOPHEN 5-325 MG PO TABS: 1.0000 | ORAL_TABLET | ORAL | Status: DC | PRN

## 2014-01-09 MED ORDER — GLYCOPYRROLATE 0.2 MG/ML IJ SOLN
INTRAMUSCULAR | Status: DC | PRN
  Administered 2014-01-09: .6 mg via INTRAVENOUS

## 2014-01-09 MED ORDER — PROMETHAZINE HCL 25 MG/ML IJ SOLN: 6.2500 mg | INTRAMUSCULAR | Status: DC | PRN

## 2014-01-09 MED ORDER — METHOCARBAMOL 500 MG PO TABS
ORAL_TABLET | ORAL | Status: AC
  Filled 2014-01-09: qty 1

## 2014-01-09 MED ORDER — ONDANSETRON HCL 4 MG/2ML IJ SOLN
INTRAMUSCULAR | Status: DC | PRN
  Administered 2014-01-09: 4 mg via INTRAVENOUS

## 2014-01-09 MED ORDER — MORPHINE SULFATE 2 MG/ML IJ SOLN: 1.0000 mg | INTRAMUSCULAR | Status: DC | PRN

## 2014-01-09 MED ORDER — SACCHAROMYCES BOULARDII 250 MG PO CAPS
250.0000 mg | ORAL_CAPSULE | Freq: Every day | ORAL | Status: DC
  Administered 2014-01-10 – 2014-01-11 (×2): 250 mg via ORAL
  Filled 2014-01-09 (×2): qty 1

## 2014-01-09 MED ORDER — METOCLOPRAMIDE HCL 5 MG/ML IJ SOLN: 5.0000 mg | Freq: Three times a day (TID) | INTRAMUSCULAR | Status: DC | PRN

## 2014-01-09 MED ORDER — FENTANYL CITRATE 0.05 MG/ML IJ SOLN
INTRAMUSCULAR | Status: DC | PRN
  Administered 2014-01-09: 50 ug via INTRAVENOUS
  Administered 2014-01-09 (×2): 25 ug via INTRAVENOUS
  Administered 2014-01-09: 50 ug via INTRAVENOUS

## 2014-01-09 MED ORDER — LIDOCAINE HCL (CARDIAC) 20 MG/ML IV SOLN
INTRAVENOUS | Status: DC | PRN
  Administered 2014-01-09: 60 mg via INTRAVENOUS

## 2014-01-09 MED ORDER — HYDROMORPHONE HCL 1 MG/ML IJ SOLN
0.2500 mg | INTRAMUSCULAR | Status: DC | PRN
  Administered 2014-01-09: 0.25 mg via INTRAVENOUS
  Administered 2014-01-09: 0.5 mg via INTRAVENOUS
  Administered 2014-01-09: 0.25 mg via INTRAVENOUS

## 2014-01-09 MED ORDER — PANTOPRAZOLE SODIUM 40 MG PO TBEC
40.0000 mg | DELAYED_RELEASE_TABLET | Freq: Every day | ORAL | Status: DC
  Administered 2014-01-10 – 2014-01-11 (×2): 40 mg via ORAL
  Filled 2014-01-09 (×2): qty 1

## 2014-01-09 MED ORDER — TRAMADOL HCL 50 MG PO TABS: 100.0000 mg | ORAL_TABLET | Freq: Four times a day (QID) | ORAL | Status: DC | PRN

## 2014-01-09 MED ORDER — ACETAMINOPHEN 500 MG PO TABS: 500.0000 mg | ORAL_TABLET | Freq: Four times a day (QID) | ORAL | Status: DC | PRN

## 2014-01-09 MED ORDER — LACTATED RINGERS IV SOLN
INTRAVENOUS | Status: DC
  Administered 2014-01-09: 13:00:00 via INTRAVENOUS

## 2014-01-09 MED ORDER — DEXAMETHASONE SODIUM PHOSPHATE 4 MG/ML IJ SOLN
INTRAMUSCULAR | Status: DC | PRN
  Administered 2014-01-09: 4 mg via INTRAVENOUS

## 2014-01-09 MED ORDER — HYDROMORPHONE HCL 1 MG/ML IJ SOLN
INTRAMUSCULAR | Status: AC
  Administered 2014-01-09: 0.5 mg via INTRAVENOUS
  Filled 2014-01-09: qty 1

## 2014-01-09 MED ORDER — SODIUM CHLORIDE 0.9 % IV SOLN
INTRAVENOUS | Status: DC
  Administered 2014-01-09: 22:00:00 via INTRAVENOUS

## 2014-01-09 MED ORDER — VITAMIN D3 25 MCG (1000 UNIT) PO TABS
2000.0000 [IU] | ORAL_TABLET | Freq: Every day | ORAL | Status: DC
  Administered 2014-01-10 – 2014-01-11 (×2): 2000 [IU] via ORAL
  Filled 2014-01-09 (×2): qty 2

## 2014-01-09 MED ORDER — PROPOFOL 10 MG/ML IV BOLUS
INTRAVENOUS | Status: AC
  Filled 2014-01-09: qty 20

## 2014-01-09 MED ORDER — ZINC SULFATE 220 (50 ZN) MG PO CAPS
220.0000 mg | ORAL_CAPSULE | Freq: Every day | ORAL | Status: DC
  Administered 2014-01-09 – 2014-01-11 (×2): 220 mg via ORAL
  Filled 2014-01-09 (×3): qty 1

## 2014-01-09 MED ORDER — FERROUS SULFATE 325 (65 FE) MG PO TABS
325.0000 mg | ORAL_TABLET | Freq: Three times a day (TID) | ORAL | Status: DC
  Administered 2014-01-10 – 2014-01-11 (×4): 325 mg via ORAL
  Filled 2014-01-09 (×7): qty 1

## 2014-01-09 MED ORDER — DOCUSATE SODIUM 100 MG PO CAPS: 100.0000 mg | ORAL_CAPSULE | Freq: Two times a day (BID) | ORAL | Status: DC | PRN

## 2014-01-09 MED ORDER — ONDANSETRON HCL 4 MG/2ML IJ SOLN: 4.0000 mg | Freq: Four times a day (QID) | INTRAMUSCULAR | Status: DC | PRN

## 2014-01-09 MED ORDER — MIDAZOLAM HCL 5 MG/5ML IJ SOLN
INTRAMUSCULAR | Status: DC | PRN
  Administered 2014-01-09: 1 mg via INTRAVENOUS
  Administered 2014-01-09: 0.5 mg via INTRAVENOUS

## 2014-01-09 MED ORDER — OXYCODONE HCL 5 MG PO TABS
5.0000 mg | ORAL_TABLET | ORAL | Status: DC | PRN
  Administered 2014-01-09: 5 mg via ORAL
  Administered 2014-01-10 (×3): 10 mg via ORAL
  Filled 2014-01-09 (×3): qty 2

## 2014-01-09 MED ORDER — VANCOMYCIN HCL IN DEXTROSE 750-5 MG/150ML-% IV SOLN
750.0000 mg | Freq: Two times a day (BID) | INTRAVENOUS | Status: DC
  Administered 2014-01-10 – 2014-01-11 (×3): 750 mg via INTRAVENOUS
  Filled 2014-01-09 (×4): qty 150

## 2014-01-09 MED ORDER — LACTATED RINGERS IV SOLN
INTRAVENOUS | Status: DC | PRN
  Administered 2014-01-09: 17:00:00 via INTRAVENOUS

## 2014-01-09 MED ORDER — OXYCODONE HCL 5 MG PO TABS
ORAL_TABLET | ORAL | Status: AC
  Administered 2014-01-09: 5 mg via ORAL
  Filled 2014-01-09: qty 1

## 2014-01-09 MED ORDER — METHOCARBAMOL 1000 MG/10ML IJ SOLN
500.0000 mg | Freq: Four times a day (QID) | INTRAVENOUS | Status: DC | PRN
  Filled 2014-01-09: qty 5

## 2014-01-09 MED ORDER — ONDANSETRON HCL 4 MG PO TABS: 4.0000 mg | ORAL_TABLET | Freq: Four times a day (QID) | ORAL | Status: DC | PRN

## 2014-01-09 MED ORDER — VANCOMYCIN HCL IN DEXTROSE 1-5 GM/200ML-% IV SOLN
1000.0000 mg | Freq: Once | INTRAVENOUS | Status: AC
  Administered 2014-01-09: 1000 mg via INTRAVENOUS

## 2014-01-09 MED ORDER — HYDROMORPHONE HCL 1 MG/ML IJ SOLN
INTRAMUSCULAR | Status: AC
  Filled 2014-01-09: qty 1

## 2014-01-09 MED ORDER — VANCOMYCIN HCL IN DEXTROSE 1-5 GM/200ML-% IV SOLN
INTRAVENOUS | Status: AC
  Filled 2014-01-09: qty 200

## 2014-01-09 MED ORDER — METHOCARBAMOL 500 MG PO TABS
500.0000 mg | ORAL_TABLET | Freq: Four times a day (QID) | ORAL | Status: DC | PRN
  Administered 2014-01-09 – 2014-01-10 (×2): 500 mg via ORAL
  Filled 2014-01-09 (×2): qty 1

## 2014-01-09 MED ORDER — NEOSTIGMINE METHYLSULFATE 10 MG/10ML IV SOLN
INTRAVENOUS | Status: DC | PRN
Start: 2014-01-09 — End: 2014-01-09
  Administered 2014-01-09: 4 mg via INTRAVENOUS

## 2014-01-09 SURGICAL SUPPLY — 45 items
BAG DECANTER FOR FLEXI CONT (MISCELLANEOUS) IMPLANT
COVER SURGICAL LIGHT HANDLE (MISCELLANEOUS) ×3 IMPLANT
DRAPE IMP U-DRAPE 54X76 (DRAPES) ×3 IMPLANT
DRAPE ORTHO SPLIT 77X108 STRL (DRAPES) ×6
DRAPE SURG ORHT 6 SPLT 77X108 (DRAPES) ×2 IMPLANT
DRAPE U-SHAPE 47X51 STRL (DRAPES) ×3 IMPLANT
DRSG ADAPTIC 3X8 NADH LF (GAUZE/BANDAGES/DRESSINGS) ×1 IMPLANT
DRSG AQUACEL AG ADV 3.5X10 (GAUZE/BANDAGES/DRESSINGS) ×2 IMPLANT
DRSG MEPILEX BORDER 4X12 (GAUZE/BANDAGES/DRESSINGS) ×2 IMPLANT
DRSG PAD ABDOMINAL 8X10 ST (GAUZE/BANDAGES/DRESSINGS) ×3 IMPLANT
DURAPREP 26ML APPLICATOR (WOUND CARE) ×3 IMPLANT
ELECT CAUTERY BLADE 6.4 (BLADE) ×2 IMPLANT
ELECT REM PT RETURN 9FT ADLT (ELECTROSURGICAL) ×3
ELECTRODE REM PT RTRN 9FT ADLT (ELECTROSURGICAL) IMPLANT
GAUZE SPONGE 4X4 12PLY STRL (GAUZE/BANDAGES/DRESSINGS) ×3 IMPLANT
GAUZE XEROFORM 5X9 LF (GAUZE/BANDAGES/DRESSINGS) ×2 IMPLANT
GLOVE BIO SURGEON STRL SZ8 (GLOVE) ×3 IMPLANT
GLOVE BIOGEL PI IND STRL 8 (GLOVE) ×1 IMPLANT
GLOVE BIOGEL PI INDICATOR 8 (GLOVE) ×2
GLOVE ORTHO TXT STRL SZ7.5 (GLOVE) ×7 IMPLANT
GOWN STRL REUS W/ TWL LRG LVL3 (GOWN DISPOSABLE) ×1 IMPLANT
GOWN STRL REUS W/ TWL XL LVL3 (GOWN DISPOSABLE) ×4 IMPLANT
GOWN STRL REUS W/TWL LRG LVL3 (GOWN DISPOSABLE) ×3
GOWN STRL REUS W/TWL XL LVL3 (GOWN DISPOSABLE) ×3
HANDPIECE INTERPULSE COAX TIP (DISPOSABLE) ×3
KIT BASIN OR (CUSTOM PROCEDURE TRAY) ×3 IMPLANT
KIT ROOM TURNOVER OR (KITS) ×3 IMPLANT
MANIFOLD NEPTUNE II (INSTRUMENTS) ×3 IMPLANT
NS IRRIG 1000ML POUR BTL (IV SOLUTION) ×1 IMPLANT
PACK TOTAL JOINT (CUSTOM PROCEDURE TRAY) ×3 IMPLANT
PACK UNIVERSAL I (CUSTOM PROCEDURE TRAY) ×1 IMPLANT
PAD ARMBOARD 7.5X6 YLW CONV (MISCELLANEOUS) ×6 IMPLANT
SET HNDPC FAN SPRY TIP SCT (DISPOSABLE) IMPLANT
SPONGE LAP 18X18 X RAY DECT (DISPOSABLE) ×1 IMPLANT
STAPLER VISISTAT 35W (STAPLE) ×1 IMPLANT
SUT ETHILON 2 0 FS 18 (SUTURE) ×3 IMPLANT
SUT VIC AB 1 CTB1 27 (SUTURE) ×2 IMPLANT
SUT VIC AB 2-0 CT1 27 (SUTURE) ×3
SUT VIC AB 2-0 CT1 TAPERPNT 27 (SUTURE) ×2 IMPLANT
SUT VICRYL 0 CT 1 36IN (SUTURE) ×3 IMPLANT
TOWEL OR 17X24 6PK STRL BLUE (TOWEL DISPOSABLE) ×3 IMPLANT
TOWEL OR 17X26 10 PK STRL BLUE (TOWEL DISPOSABLE) ×3 IMPLANT
TUBE ANAEROBIC SPECIMEN COL (MISCELLANEOUS) ×2 IMPLANT
UNDERPAD 30X30 INCONTINENT (UNDERPADS AND DIAPERS) ×1 IMPLANT
WATER STERILE IRR 1000ML POUR (IV SOLUTION) ×1 IMPLANT

## 2014-01-09 NOTE — Anesthesia Preprocedure Evaluation (Addendum)
Anesthesia Evaluation  Patient identified by MRN, date of birth, ID band Patient awake    Reviewed: Allergy & Precautions, H&P , NPO status , Patient's Chart, lab work & pertinent test results  Airway Mallampati: I  TM Distance: >3 FB Neck ROM: Full    Dental  (+) Dental Advisory Given, Chipped   Pulmonary          Cardiovascular hypertension, Pt. on medications     Neuro/Psych    GI/Hepatic PUD,   Endo/Other    Renal/GU      Musculoskeletal   Abdominal   Peds  Hematology   Anesthesia Other Findings   Reproductive/Obstetrics                            Anesthesia Physical Anesthesia Plan  ASA: II  Anesthesia Plan: General   Post-op Pain Management:    Induction: Intravenous  Airway Management Planned: Oral ETT  Additional Equipment:   Intra-op Plan:   Post-operative Plan: Extubation in OR  Informed Consent: I have reviewed the patients History and Physical, chart, labs and discussed the procedure including the risks, benefits and alternatives for the proposed anesthesia with the patient or authorized representative who has indicated his/her understanding and acceptance.     Plan Discussed with: CRNA and Surgeon  Anesthesia Plan Comments:         Anesthesia Quick Evaluation

## 2014-01-09 NOTE — Progress Notes (Signed)
ANTIBIOTIC CONSULT NOTE - INITIAL  Pharmacy Consult for Vancomycin Indication: Infected hip replacement  Allergies  Allergen Reactions  . Penicillins Rash    Patient Measurements: Weight: 157 lb (71.215 kg) Adjusted Body Weight:   Vital Signs: Temp: 97.4 F (36.3 C) (11/02 2005) Temp Source: Oral (11/02 1303) BP: 158/64 mmHg (11/02 2005) Pulse Rate: 75 (11/02 2005) Intake/Output from previous day:   Intake/Output from this shift:    Labs:  Recent Labs  01/09/14 1254  WBC 8.4  HGB 10.6*  PLT 443*  CREATININE 0.94   Estimated Creatinine Clearance: 43.9 mL/min (by C-G formula based on Cr of 0.94). No results for input(s): VANCOTROUGH, VANCOPEAK, VANCORANDOM, GENTTROUGH, GENTPEAK, GENTRANDOM, TOBRATROUGH, TOBRAPEAK, TOBRARND, AMIKACINPEAK, AMIKACINTROU, AMIKACIN in the last 72 hours.   Microbiology: Recent Results (from the past 720 hour(s))  Surgical pcr screen     Status: None   Collection Time: 12/14/13 12:32 PM  Result Value Ref Range Status   MRSA, PCR NEGATIVE NEGATIVE Final   Staphylococcus aureus NEGATIVE NEGATIVE Final    Comment:        The Xpert SA Assay (FDA approved for NASAL specimens in patients over 78 years of age), is one component of a comprehensive surveillance program.  Test performance has been validated by [facility] for patients greater than or equal to 78 year old. It is not intended to diagnose infection nor to guide or monitor treatment.    Medical History: Past Medical History  Diagnosis Date  . Hypertension   . Pneumonia   . Arthritis     left hip  . Colon polyps     type not known, year(s) of occurrence not defined.   . Acute duodenal ulcer with hemorrhage 01/18/2013  . Acute gastric ulcers with hemorrhage 01/18/2013  . Aortic valve replaced   . Nocturia   . Anemia   . History of transfusion     Medications:  Scheduled:  . aspirin EC  325 mg Oral Daily  . [START ON 01/10/2014] cholecalciferol  2,000 Units Oral  Daily  . [START ON 01/10/2014] ferrous sulfate  325 mg Oral TID PC  . methocarbamol      . [START ON 01/10/2014] metoprolol tartrate  25 mg Oral Daily  . [START ON 01/10/2014] pantoprazole  40 mg Oral QAC breakfast  . [START ON 01/10/2014] saccharomyces boulardii  250 mg Oral Daily  . vancomycin      . [START ON 01/10/2014] vancomycin  750 mg Intravenous Q12H  . vitamin C  500 mg Oral BID  . zinc sulfate  220 mg Oral Daily   Assessment: 80 yr old female who underwent a left total hip replacement 10/16 had been d/c'd home was seen in a post op visit and thought to have a post op infection. She was brought back to the OR for irrigation and debridement of the wound and antibiotics. She got vancomycin 1 Gm during surgery. Pharmacy is to dose following surgery for post op wound infection.  Goal of Therapy:  Vancomycin trough level 15-20 mcg/ml  Plan:  Vancomycin 750 mg IV q12 hrs starting 12 hrs from OR dose. Vanc trough when appropriate.  Eugene Garnetotter, Kortnee Sue 01/09/2014,9:09 PM

## 2014-01-09 NOTE — Transfer of Care (Signed)
Immediate Anesthesia Transfer of Care Note  Patient: [name] PerchesBarbara [name]  Procedure(s) Performed: Procedure(s): IRRIGATION AND DEBRIDEMENT LEFT HIP (Left)  Patient Location: PACU  Anesthesia Type:General  Level of Consciousness: awake and alert   Airway & Oxygen Therapy: Patient Spontanous Breathing and Patient connected to nasal cannula oxygen  Post-op Assessment: Report given to PACU RN and Post -op Vital signs reviewed and stable  Post vital signs: Reviewed and stable  Complications: No apparent anesthesia complications

## 2014-01-09 NOTE — H&P (Signed)
[name] is an 80 y.o. female.   Chief Complaint:  Left hip incision with questionable infection HPI:   80 yo female just over 2 weeks post a left direct anterior hip replacement.  Has done very well post-operatively.  i was her in the office in follow-up just this past Thursday and her incision looks good for the most part.  We had closed it in layers with no sutures to remove and steri-strips on the skin.  She also had an Aquacell dressing that was kept on and intact for 2 weeks.  At her appointment on Thursday there has only one small area that was minimally wet and I felt this was related to her dressing.  I cleaned it and placed new steri-strips as well as put her on Doxycycline twice daily.  Over this past weekend, she developed some more drainage and I saw her in the office today.  The cellulitis had resolved, but I could express purulent material from her incision.  Given this, I feel that an open irrigation of her left hip incision is warranted to ensure an infection does not run deep or communicate with her prosthesis.  So far, this only appears superficial and she understands the reasoning behind proceeding with surgery and being as aggressive as we are.  She will be admitted after surgery for IV antibiotics and continued physical therapy.  Past Medical History  Diagnosis Date  . Hypertension   . Pneumonia   . Arthritis     left hip  . Colon polyps     type not known, year(s) of occurrence not defined.   . Acute duodenal ulcer with hemorrhage 01/18/2013  . Acute gastric ulcers with hemorrhage 01/18/2013  . Aortic valve replaced   . Nocturia   . Anemia   . History of transfusion     Past Surgical History  Procedure Laterality Date  . Tissue aortic valve replacement  Nov 18 2012    at [place]  . Dilation and curettage of uterus      x 2.  . Cataract extraction Bilateral   . Esophagogastroduodenoscopy N/A 01/18/2013    Procedure: ESOPHAGOGASTRODUODENOSCOPY (EGD);   Surgeon: Gatha Mayer, MD;  Location: [facility] ENDOSCOPY;  Service: Endoscopy;  Laterality: N/A;  . Total hip arthroplasty Left 12/23/2013    Procedure: TOTAL HIP ARTHROPLASTY ANTERIOR APPROACH, LEFT;  Surgeon: Mcarthur Rossetti, MD;  Location: [facility] ORS;  Service: Orthopedics;  Laterality: Left;    Family History  Problem Relation Age of Onset  . Colon cancer Neg Hx   . Breast cancer Neg Hx   . Throat cancer Neg Hx   . Stomach cancer Neg Hx    Social History:  reports that she has never smoked. She does not have any smokeless tobacco history on file. She reports that she does not drink alcohol or use illicit drugs.  Allergies:  Allergies  Allergen Reactions  . Penicillins Rash    Medications Prior to Admission  Medication Sig Dispense Refill  . acetaminophen (TYLENOL) 500 MG tablet Take 500 mg by mouth every 6 (six) hours as needed.    Marland Kitchen aspirin EC 325 MG EC tablet Take 1 tablet (325 mg total) by mouth 2 (two) times daily after a meal. 30 tablet 0  . Cholecalciferol (VITAMIN D3) 2000 UNITS capsule Take 2,000 Units by mouth every morning.    . docusate sodium 100 MG CAPS Take 100 mg by mouth 2 (two) times daily as needed for mild constipation. 10 capsule  0  . ferrous sulfate 325 (65 FE) MG tablet Take 1 tablet (325 mg total) by mouth 3 (three) times daily after meals. 60 tablet 0  . metoprolol tartrate (LOPRESSOR) 25 MG tablet Take 25 mg by mouth every morning.    Marland Kitchen oxyCODONE-acetaminophen (ROXICET) 5-325 MG per tablet Take 1 tablet by mouth every 4 (four) hours as needed. 180 tablet 0  . pantoprazole (PROTONIX) 40 MG tablet Take 40 mg by mouth every morning.    Marland Kitchen Propylene Glycol (SYSTANE BALANCE OP) Apply 1-2 drops to eye once as needed (dry eyes.).    Marland Kitchen saccharomyces boulardii (FLORASTOR) 250 MG capsule Take 250 mg by mouth every morning.    . traMADol (ULTRAM) 50 MG tablet Take 2 tablets (100 mg total) by mouth every 6 (six) hours as needed for moderate pain. 30 tablet 0  . calcium  carbonate (TUMS - DOSED IN MG ELEMENTAL CALCIUM) 500 MG chewable tablet Chew 1 tablet by mouth as needed for indigestion.    Marland Kitchen guaiFENesin (MUCINEX) 600 MG 12 hr tablet Take 600 mg by mouth 2 (two) times daily as needed for cough or to loosen phlegm.    . Menthol, Topical Analgesic, 154 MG PADS Apply 1 each topically once as needed (pain in knee.).    Marland Kitchen Multiple Vitamins-Minerals (ICAPS PO) Take 1 tablet by mouth every morning.    Mable Fill (NAPHCON-A OP) Apply 1-2 drops to eye once as needed (allergies.).    Marland Kitchen Trolamine Salicylate (ASPERCREME EX) Apply 1 application topically once as needed (pain.).      Results for orders placed or performed during the hospital encounter of 01/09/14 (from the past 48 hour(s))  Sedimentation rate     Status: Abnormal   Collection Time: 01/09/14 12:54 PM  Result Value Ref Range   Sed Rate 39 (H) 0 - 22 mm/hr  CBC     Status: Abnormal   Collection Time: 01/09/14 12:54 PM  Result Value Ref Range   WBC 8.4 4.0 - 10.5 K/uL   RBC 3.70 (L) 3.87 - 5.11 MIL/uL   Hemoglobin 10.6 (L) 12.0 - 15.0 g/dL   HCT 33.4 (L) 36.0 - 46.0 %   MCV 90.3 78.0 - 100.0 fL   MCH 28.6 26.0 - 34.0 pg   MCHC 31.7 30.0 - 36.0 g/dL   RDW 17.1 (H) 11.5 - 15.5 %   Platelets 443 (H) 150 - 400 K/uL  Basic metabolic panel     Status: Abnormal   Collection Time: 01/09/14 12:54 PM  Result Value Ref Range   Sodium 142 137 - 147 mEq/L   Potassium 4.9 3.7 - 5.3 mEq/L   Chloride 102 96 - 112 mEq/L   CO2 25 19 - 32 mEq/L   Glucose, Bld 99 70 - 99 mg/dL   BUN 16 6 - 23 mg/dL   Creatinine, Ser 0.94 0.50 - 1.10 mg/dL   Calcium 9.7 8.4 - 10.5 mg/dL   GFR calc non Af Amer 55 (L) >90 mL/min   GFR calc Af Amer 63 (L) >90 mL/min    Comment: (NOTE) The eGFR has been calculated using the CKD EPI equation. This calculation has not been validated in all clinical situations. eGFR's persistently <90 mL/min signify possible Chronic Kidney Disease.    Anion gap 15 5 - 15   No  results found.  Review of Systems  All other systems reviewed and are negative.   Blood pressure 188/74, pulse 80, temperature 97.5 F (36.4 C), temperature source Oral,  weight 71.215 kg (157 lb), SpO2 100 %. Physical Exam  Constitutional: She is oriented to person, place, and time. She appears well-developed and well-nourished.  HENT:  Head: Normocephalic and atraumatic.  Eyes: Pupils are equal, round, and reactive to light.  Neck: Normal range of motion. Neck supple.  Cardiovascular: Normal rate and regular rhythm.   Respiratory: Effort normal and breath sounds normal.  GI: Soft. Bowel sounds are normal.  Musculoskeletal:       Legs: Neurological: She is alert and oriented to person, place, and time.  Skin: Skin is warm and dry.  Psychiatric: She has a normal mood and affect.     Assessment/Plan Left hip incision with post-operative infection 1)  To the OR tonight for an irrigation of her left hip incision and admission for IV antibiotics.  Rielyn Krupinski Y 01/09/2014, 4:55 PM

## 2014-01-09 NOTE — Anesthesia Postprocedure Evaluation (Signed)
Anesthesia Post Note  Patient: [name] PerchesBarbara [name]  Procedure(s) Performed: Procedure(s) (LRB): IRRIGATION AND DEBRIDEMENT LEFT HIP (Left)  Anesthesia type: general  Patient location: PACU  Post pain: Pain level controlled  Post assessment: Patient's Cardiovascular Status Stable  Last Vitals:  Filed Vitals:   01/09/14 2005  BP: 158/64  Pulse: 75  Temp: 36.3 C  Resp: 19    Post vital signs: Reviewed and stable  Level of consciousness: sedated  Complications: No apparent anesthesia complications

## 2014-01-09 NOTE — Brief Op Note (Signed)
01/09/2014  6:48 PM  PATIENT:  [name] PerchesBarbara [name]  80 y.o. female  PRE-OPERATIVE DIAGNOSIS:  Post op infection left hip  POST-OPERATIVE DIAGNOSIS:  same   PROCEDURE:  Procedure(s): IRRIGATION AND DEBRIDEMENT LEFT HIP (Left)  SURGEON:  Surgeon(s) and Role:    * Kathryne Hitchhristopher Y Hana Trippett, MD - Primary  PHYSICIAN ASSISTANT:   ASSISTANTS: none   ANESTHESIA:   general  EBL:   < 100 cc  BLOOD ADMINISTERED:none  DRAINS: none   LOCAL MEDICATIONS USED:  NONE  SPECIMEN:  No Specimen  DISPOSITION OF SPECIMEN:  N/A  COUNTS:  YES  TOURNIQUET:  * No tourniquets in log *  DICTATION: .Other Dictation: Dictation Number [id number]  PLAN OF CARE: Admit to inpatient   PATIENT DISPOSITION:  PACU - hemodynamically stable.   Delay start of Pharmacological VTE agent (>24hrs) due to surgical blood loss or risk of bleeding: no

## 2014-01-09 NOTE — Anesthesia Procedure Notes (Signed)
Procedure Name: Intubation Date/Time: 01/09/2014 5:41 PM Performed by: Armandina GemmaMIRARCHI, Natia Fahmy Pre-anesthesia Checklist: Patient identified, Timeout performed, Emergency Drugs available, Suction available and Patient being monitored Patient Re-evaluated:Patient Re-evaluated prior to inductionOxygen Delivery Method: Circle system utilized Preoxygenation: Pre-oxygenation with 100% oxygen Intubation Type: IV induction Ventilation: Mask ventilation without difficulty Laryngoscope Size: Miller and 2 Grade View: Grade I Tube type: Oral Tube size: 7.0 mm Number of attempts: 1 Airway Equipment and Method: Stylet Placement Confirmation: ETT inserted through vocal cords under direct vision,  breath sounds checked- equal and bilateral and positive ETCO2 Secured at: 21 cm Tube secured with: Tape Dental Injury: Teeth and Oropharynx as per pre-operative assessment  Comments: IV induction Hodierene- intubation AM CRNA- atraumatic teeth and mouth as preop - chipped tooth upper back

## 2014-01-10 LAB — CBC
HCT: 31.8 % — ABNORMAL LOW (ref 36.0–46.0)
Hemoglobin: 10.1 g/dL — ABNORMAL LOW (ref 12.0–15.0)
MCH: 28.7 pg (ref 26.0–34.0)
MCHC: 31.8 g/dL (ref 30.0–36.0)
MCV: 90.3 fL (ref 78.0–100.0)
Platelets: 439 10*3/uL — ABNORMAL HIGH (ref 150–400)
RBC: 3.52 MIL/uL — ABNORMAL LOW (ref 3.87–5.11)
RDW: 16.9 % — ABNORMAL HIGH (ref 11.5–15.5)
WBC: 6.3 10*3/uL (ref 4.0–10.5)

## 2014-01-10 LAB — BASIC METABOLIC PANEL
Anion gap: 13 (ref 5–15)
BUN: 14 mg/dL (ref 6–23)
CO2: 24 mEq/L (ref 19–32)
Calcium: 9.3 mg/dL (ref 8.4–10.5)
Chloride: 101 mEq/L (ref 96–112)
Creatinine, Ser: 0.88 mg/dL (ref 0.50–1.10)
GFR calc Af Amer: 69 mL/min — ABNORMAL LOW (ref >90)
GFR calc non Af Amer: 59 mL/min — ABNORMAL LOW (ref >90)
Glucose, Bld: 121 mg/dL — ABNORMAL HIGH (ref 70–99)
Potassium: 5.1 mEq/L (ref 3.7–5.3)
Sodium: 138 mEq/L (ref 137–147)

## 2014-01-10 LAB — C-REACTIVE PROTEIN: CRP: 1.9 mg/dL — ABNORMAL HIGH (ref <0.60)

## 2014-01-10 MED ORDER — CIPROFLOXACIN HCL 500 MG PO TABS
500.0000 mg | ORAL_TABLET | Freq: Two times a day (BID) | ORAL | Status: DC
  Administered 2014-01-10 – 2014-01-11 (×3): 500 mg via ORAL
  Filled 2014-01-10 (×5): qty 1

## 2014-01-10 NOTE — Plan of Care (Signed)
Problem: Phase II Progression Outcomes Goal: Pain controlled Outcome: Completed/Met Date Met:  01/10/14 Goal: Progressing with IS, TCDB Outcome: Progressing Goal: Dressings dry/intact Outcome: Completed/Met Date Met:  01/10/14 Goal: Tolerating diet Outcome: Completed/Met Date Met:  01/10/14

## 2014-01-10 NOTE — Evaluation (Signed)
Physical Therapy Evaluation Patient Details Name: [name] MRN: [id number] DOB: 1929/01/24 Today's Date: 01/10/2014   History of Present Illness  Admitted for I and D of L hip; post L THA anterior direct about 2 weeks ago  Clinical Impression  Patient evaluated by Physical Therapy with no further acute PT needs identified. All education has been completed and the patient has no further questions. Modified independent with all functional mobility; Round of therex complete, and pt palns to perform therex independently tonight; on track for dc home tomorrow;   See below for any follow-up Physical Therapy or equipment needs. PT is signing off. Thank you for this referral.     Follow Up Recommendations Home health PT    Equipment Recommendations  None recommended by PT    Recommendations for Other Services  (Resumption of HH therapy services)     Precautions / Restrictions Restrictions LLE Weight Bearing: Weight bearing as tolerated      Mobility  Bed Mobility Overal bed mobility: Modified Independent Bed Mobility: Supine to Sit;Sit to Supine     Supine to sit: Modified independent (Device/Increase time) Sit to supine: Modified independent (Device/Increase time)      Transfers Overall transfer level: Modified independent Equipment used: Rolling walker (2 wheeled) Transfers: Sit to/from Stand Sit to Stand: Modified independent (Device/Increase time)            Ambulation/Gait Ambulation/Gait assistance: Supervision;Modified independent (Device/Increase time) Ambulation Distance (Feet): 520 Feet Assistive device: Rolling walker (2 wheeled) Gait Pattern/deviations: Step-through pattern;Trunk flexed Gait velocity: approaching WNL   General Gait Details: Cues mostly for posture  Stairs            Wheelchair Mobility    Modified Rankin (Stroke Patients Only)       Balance                                             Pertinent  Vitals/Pain Pain Assessment: 0-10 Pain Score: 2  Pain Location: L hip pulling sensation Pain Descriptors / Indicators: Discomfort Pain Intervention(s): Monitored during session    Home Living Family/patient expects to be discharged to:: Private residence Living Arrangements: Alone Available Help at Discharge: Family;Personal care attendant Type of Home: House Home Access: Level entry (nearly level entry)     Home Layout: One level Home Equipment: Environmental consultant - 2 wheels;Bedside commode      Prior Function Level of Independence: Independent with assistive device(s)               Hand Dominance   Dominant Hand: Right    Extremity/Trunk Assessment   Upper Extremity Assessment: Overall WFL for tasks assessed           Lower Extremity Assessment: Overall WFL for tasks assessed      Cervical / Trunk Assessment: Normal  Communication   Communication: No difficulties  Cognition Arousal/Alertness: Awake/alert Behavior During Therapy: WFL for tasks assessed/performed Overall Cognitive Status: Within Functional Limits for tasks assessed                      General Comments      Exercises Total Joint Exercises Ankle Circles/Pumps: AROM;Both;15 reps;Supine Quad Sets: AROM;Left;10 reps Gluteal Sets: AROM;Both;10 reps Towel Squeeze: AROM;Both;10 reps Heel Slides: AROM;Left;10 reps Hip ABduction/ADduction: AROM;Left;10 reps (with manual tracking resistance) Bridges: AROM;Both;10 reps      Assessment/Plan  PT Assessment All further PT needs can be met in the next venue of care  PT Diagnosis Difficulty walking   PT Problem List Decreased strength;Decreased range of motion;Decreased activity tolerance;Decreased mobility;Decreased knowledge of use of DME;Pain  PT Treatment Interventions     PT Goals (Current goals can be found in the Care Plan section) Acute Rehab PT Goals Patient Stated Goal: back home; would like to get back to work PT Goal Formulation:  All assessment and education complete, DC therapy    Frequency     Barriers to discharge        Co-evaluation               End of Session   Activity Tolerance: Patient tolerated treatment well Patient left: in chair;with call bell/phone within reach;with family/visitor present Nurse Communication: Mobility status         Time: 1007-1219 PT Time Calculation (min): 40 min   Charges:   PT Evaluation $Initial PT Evaluation Tier I: 1 Procedure PT Treatments $Gait Training: 8-22 mins $Therapeutic Exercise: 8-22 mins   PT G Codes:          Quin Hoop 01/10/2014, 12:25 PM  Roney Marion, [place] Pager [phone] Office [phone]

## 2014-01-10 NOTE — Care Management Note (Signed)
CARE MANAGEMENT NOTE 01/10/2014  Patient:  [name],[name]   Account Number:  [id number]  Date Initiated:  01/10/2014  Documentation initiated by:  Vance PeperBRADY,Haille Pardi  Subjective/Objective Assessment:   80 yr old female admitted with left hip postop infection, s/p left hip arthroplasty 2 weeks ago. Patient had  I & D of left hip.     Action/Plan:   PT/OT eval.  Case manager will continue to monitor.   Anticipated DC Date:     Anticipated DC Plan:  HOME W HOME HEALTH SERVICES      DC Planning Services  CM consult      Choice offered to / List presented to:             Status of service:  In process, will continue to follow Medicare Important Message given?   (If response is "NO", the following Medicare IM given date fields will be blank) Date Medicare IM given:   Medicare IM given by:   Date Additional Medicare IM given:   Additional Medicare IM given by:    Discharge Disposition:    Per UR Regulation:  Reviewed for med. necessity/level of care/duration of stay  If discussed at Long Length of Stay Meetings, dates discussed:    Comments:

## 2014-01-10 NOTE — Progress Notes (Signed)
OT Cancellation Note and Discharge  Patient Details Name: [name] PerchesBarbara [name] MRN: [id number] DOB: 02-28-1929   Cancelled Treatment:    Reason Eval/Treat Not Completed: OT screened, no needs identified, will sign off. Pt here for an I & D of her hip, spoke to patient about role of OT and she does not feel she needs OT services.  Evette GeorgesLeonard, Shruti Arrey Eva [phone] 01/10/2014, 9:04 AM

## 2014-01-10 NOTE — Progress Notes (Signed)
Patient ID: [name] PerchesBarbara Bogan, female   DOB: 01-20-1929, 80 y.o.   MRN: [id number] Thus far her gram stain only shows a few gram positive rods.  Possibly just a skin contaminant.  Will start oral cipro.  Anticipate discharge to home tomorrow on oral antibiotics only.

## 2014-01-10 NOTE — Op Note (Signed)
NAMAhmed Prima:  [name], [name]              ACCOUNT NO.:  [id number]  MEDICAL RECORD NO.:  [id number]  LOCATION:  [id number]                        FACILITY:  [facility]  PHYSICIAN:  Vanita PandaChristopher Y. Magnus IvanBlackman, M.D.DATE OF BIRTH:  [birth year]  DATE OF PROCEDURE:  01/09/2014 DATE OF DISCHARGE:                              OPERATIVE REPORT   PREOPERATIVE DIAGNOSIS:  Postoperative infection of left hip incision, status post total hip arthroplasty.  POSTOPERATIVE DIAGNOSIS:  Postoperative infection of left hip incision, status post total hip arthroplasty.  FINDINGS:  Superficial necrosis of adipose tissue proximal to the tensor fascia lata with no deep infection or purulent material encountered. Gram stain and cultures pending.  PROCEDURE:  Irrigation and debridement of left hip incision from skin down to the tensor fascia lata with sharp excisional debridement of soft tissue and adipose tissue only.  SURGEON:  Vanita PandaChristopher Y. Magnus IvanBlackman, M.D.  ANESTHESIA:  General.  BLOOD LOSS:  Less than 100 mL.  COMPLICATIONS:  None.  INDICATIONS:  [name] is an 80 year old female, who just over 2 weeks ago underwent a left total hip arthroplasty without complications.  The surgery was relatively quick.  She was MRSA negative and Staph negative given her PCR screen.  She got IV antibiotics at the time of surgery and 3 doses postoperatively.  We closed her hip in layers with Monocryl at the subcuticular layer.  Steri-Strips and Aquacel dressing were applied. She was eventually discharged from the hospital to a skilled nursing facility where she underwent rehabilitation.  I saw her on Thursday of last week which was 2 weeks after surgery.  I took the Aquacel dressing off, and there was just area centrally that was just slightly wet, and I felt this may have been from her dressing.  I cleaned this thoroughly and placed 2 new Steri-Strips, but I decided to place her on oral doxycycline with the thought that this may  just be too aggressive type of treatment, however, given her age of 80, I did not want anything to worsen.  On Saturday which is 2 days later, her daughter sent me a picture of her hip incision, it was getting a little bit more red with some drainage, so I had her continue doxycycline and I saw her in the office today.  On examination, she was having no deep pain in her left hip at all, but I could express purulence from the incision site. Recommended she undergo an irrigation and debridement of her left hip incision today as an add-on case.  The risks and benefits of this have been explained in detail, and they did wish to proceed with surgery.  PROCEDURE DESCRIPTION:  After informed consent was obtained, appropriate left hip was marked.  She was brought to the operating room.  General anesthesia was obtained while she was on her stretcher.  Next, she was placed supine on the Hana fracture table with the perineal post in place and both legs in inline of skeletal traction boots, but no traction applied.  Her left operative hip was then prepped and draped with DuraPrep and sterile drapes.  A time-out was called and she was identified as correct patient, correct left hip.  I then opened up  the inferior portion of her incision up to close to the top of the incision and found some necrotic adipose tissue superficially and some slight purulence.  I was able to obtain Gram stain and cultures from this prior receiving IV dose of vancomycin.  I explored the superficial tissues and found again some necrotic adipose tissue and Vicryl suture, but no purulence tracking anywhere deep to the tensor fascia lata.  Using 3 L normal saline solution and pulsatile lavage, I lavaged out the hip incision of the skin and superficial and deep tissues down to the tensor fascia lata.  I then passed off any instrumentation we had used and changed gloves.  I then was able to completely dry the wound, and I looked  thoroughly to see if there was any opening between the tensor fascia, the superficial tissue and the deep tissue and did not find any at all.  Of note, her preoperative white blood cell count was 8, she was afebrile, and her sed rate was 38 with a CRP pending.  With those findings, I felt we should not violate the joint itself.  I then irrigated with another liter of normal saline solution to the incision and closed the deep tissue loosely with 0 Vicryl followed by 2-0 Vicryl in subcutaneous tissue and interrupted 2-0 nylon on the skin.  Xeroform and well-padded sterile dressing were applied.  She was taken off of the Hana table, awakened and extubated to recovery room in stable condition. All final counts were correct.  No complications noted. Postoperatively, we will get her up with therapy tomorrow, will continue weightbearing as tolerated with no hip precautions.  I will probably keep her on vancomycin for the next 24 hours until results of Gram stain and culture, with hope that we will send her home on oral antibiotics in the next 48 to 72 hours.     Vanita Pandahristopher Y. Magnus IvanBlackman, M.D.     CYB/MEDQ  D:  01/09/2014  T:  01/10/2014  Job:  [id number]

## 2014-01-10 NOTE — Progress Notes (Signed)
Subjective: 1 Day Post-Op Procedure(s) (LRB): IRRIGATION AND DEBRIDEMENT LEFT HIP (Left) Patient reports pain as mild.  WBC normal.  Has been afebrile.  Chronic anemia (the same H/H as pre-op).  ESR and CR-P only mildly elevated.  Objective: Vital signs in last 24 hours: Temp:  [97.3 F (36.3 C)-98.2 F (36.8 C)] 98.2 F (36.8 C) (11/03 0558) Pulse Rate:  [72-83] 72 (11/03 0558) Resp:  [10-19] 19 (11/03 0558) BP: (132-188)/(63-79) 132/79 mmHg (11/03 0558) SpO2:  [99 %-100 %] 99 % (11/03 0558) Weight:  [71.215 kg (157 lb)] 71.215 kg (157 lb) (11/02 1303)  Intake/Output from previous day: 11/02 0701 - 11/03 0700 In: 900 [I.V.:900] Out: 25 [Blood:25] Intake/Output this shift:     Recent Labs  01/09/14 1254 01/10/14 0701  HGB 10.6* 10.1*    Recent Labs  01/09/14 1254 01/10/14 0701  WBC 8.4 6.3  RBC 3.70* 3.52*  HCT 33.4* 31.8*  PLT 443* 439*    Recent Labs  01/09/14 1254  NA 142  K 4.9  CL 102  CO2 25  BUN 16  CREATININE 0.94  GLUCOSE 99  CALCIUM 9.7   No results for input(s): LABPT, INR in the last 72 hours.  Sensation intact distally Intact pulses distally Dorsiflexion/Plantar flexion intact Incision: dressing C/D/I  Assessment/Plan: 1 Day Post-Op Procedure(s) (LRB): IRRIGATION AND DEBRIDEMENT LEFT HIP (Left) Up with therapy Plan for discharge tomorrow to home with HHPT Continue IV antibiotics the next 24 hours.  Rilea Arutyunyan Y 01/10/2014, 7:41 AM

## 2014-01-11 ENCOUNTER — Encounter (HOSPITAL_COMMUNITY): Payer: Self-pay | Admitting: Orthopaedic Surgery

## 2014-01-11 MED ORDER — ASCORBIC ACID 500 MG PO TABS: 500.0000 mg | ORAL_TABLET | Freq: Two times a day (BID) | ORAL | Status: AC

## 2014-01-11 MED ORDER — ASPIRIN 325 MG PO TBEC: 325.0000 mg | DELAYED_RELEASE_TABLET | Freq: Every day | ORAL | Status: DC

## 2014-01-11 MED ORDER — ZINC SULFATE 220 (50 ZN) MG PO CAPS: 220.0000 mg | ORAL_CAPSULE | Freq: Every day | ORAL | Status: AC

## 2014-01-11 MED ORDER — CIPROFLOXACIN HCL 500 MG PO TABS: 500.0000 mg | ORAL_TABLET | Freq: Two times a day (BID) | ORAL | Status: DC

## 2014-01-11 NOTE — Progress Notes (Signed)
Patient ID: [name] PerchesBarbara [name], female   DOB: March 08, 1929, 80 y.o.   MRN: [id number] Doing very well.  Incision clean, dry, and intact.  No drainage.  No redness.  Cultures with no growth thus far.  Only a few gram positive rods on gram stain.  Felling well.  Afebrile with stable vitals.  Can discharge to home today.

## 2014-01-11 NOTE — Plan of Care (Signed)
Problem: Discharge Progression Outcomes Goal: Barriers To Progression Addressed/Resolved Outcome: Completed/Met Date Met:  01/11/14 Goal: Discharge plan in place and appropriate Outcome: Completed/Met Date Met:  01/11/14 Goal: Pain controlled with appropriate interventions Outcome: Completed/Met Date Met:  01/11/14 Goal: Hemodynamically stable Outcome: Completed/Met Date Met:  53/29/92 Goal: Complications resolved/controlled Outcome: Completed/Met Date Met:  01/11/14 Goal: Tolerating diet Outcome: Completed/Met Date Met:  01/11/14 Goal: Activity appropriate for discharge plan Outcome: Completed/Met Date Met:  01/11/14 Goal: Tubes and drains discontinued if indicated Outcome: Not Applicable Date Met:  42/68/34 Goal: Staples/sutures removed Outcome: Not Applicable Date Met:  19/62/22 Goal: Steri-Strips applied Outcome: Not Applicable Date Met:  97/98/92 Goal: Other Discharge Outcomes/Goals Outcome: Completed/Met Date Met:  01/11/14

## 2014-01-11 NOTE — Discharge Summary (Signed)
Patient ID: [name] MRN: [id number] DOB/AGE: 09-13-1928 80 y.o.  Admit date: 01/09/2014 Discharge date: 01/11/2014  Admission Diagnoses:  Principal Problem:   Postoperative wound infection left hip Active Problems:   Post-operative infection   Discharge Diagnoses:  Same  Past Medical History  Diagnosis Date  . Hypertension   . Pneumonia   . Arthritis     left hip  . Colon polyps     type not known, year(s) of occurrence not defined.   . Acute duodenal ulcer with hemorrhage 01/18/2013  . Acute gastric ulcers with hemorrhage 01/18/2013  . Aortic valve replaced   . Nocturia   . Anemia   . History of transfusion     Surgeries: Procedure(s): IRRIGATION AND DEBRIDEMENT LEFT HIP on 01/09/2014   Consultants:    Discharged Condition: Improved  Hospital Course: [name] is an 80 y.o. female who was admitted 01/09/2014 for operative treatment ofPostoperative wound infection. Patient has severe unremitting pain that affects sleep, daily activities, and work/hobbies. After pre-op clearance the patient was taken to the operating room on 01/09/2014 and underwent  Procedure(s): IRRIGATION AND DEBRIDEMENT LEFT HIP.    Patient was given perioperative antibiotics: Anti-infectives    Start     Dose/Rate Route Frequency Ordered Stop   01/11/14 0000  ciprofloxacin (CIPRO) 500 MG tablet     500 mg Oral 2 times daily 01/11/14 0703     01/10/14 1300  ciprofloxacin (CIPRO) tablet 500 mg     500 mg Oral 2 times daily 01/10/14 1215     01/10/14 0600  vancomycin (VANCOCIN) IVPB 750 mg/150 ml premix     750 mg150 mL/hr over 60 Minutes Intravenous Every 12 hours 01/09/14 2107     01/09/14 1720  vancomycin (VANCOCIN) 1 GM/200ML IVPB    Comments:  Rogelia Mire   : cabinet override      01/09/14 1720 01/10/14 0529   01/09/14 1715  vancomycin (VANCOCIN) IVPB 1000 mg/200 mL premix     1,000 mg200 mL/hr over 60 Minutes Intravenous  Once 01/09/14 1713 01/09/14 1903       Patient was  given sequential compression devices, early ambulation, and chemoprophylaxis to prevent DVT.  Patient benefited maximally from hospital stay and there were no complications.  She responded well to the antibiotics and her cultures were negative at the time of discharge.  Her gram stain did show a few gram positive rods and this was felt to be likely a contaminant.  However, cipro was started.  Recent vital signs: Patient Vitals for the past 24 hrs:  BP Temp Temp src Pulse Resp SpO2  01/11/14 0524 (!) 148/62 mmHg 99 F (37.2 C) - 96 16 97 %  01/10/14 2154 (!) 123/51 mmHg 97.8 F (36.6 C) - 99 18 95 %  01/10/14 1559 (!) 127/48 mmHg 98.1 F (36.7 C) Oral 80 18 100 %     Recent laboratory studies:  Recent Labs  01/09/14 1254 01/10/14 0701  WBC 8.4 6.3  HGB 10.6* 10.1*  HCT 33.4* 31.8*  PLT 443* 439*  NA 142 138  K 4.9 5.1  CL 102 101  CO2 25 24  BUN 16 14  CREATININE 0.94 0.88  GLUCOSE 99 121*  CALCIUM 9.7 9.3     Discharge Medications:     Medication List    STOP taking these medications        calcium carbonate 500 MG chewable tablet  Commonly known as:  TUMS - dosed in mg elemental calcium  oxyCODONE-acetaminophen 5-325 MG per tablet  Commonly known as:  ROXICET      TAKE these medications        acetaminophen 500 MG tablet  Commonly known as:  TYLENOL  Take 500 mg by mouth every 6 (six) hours as needed for fever.     ascorbic acid 500 MG tablet  Commonly known as:  VITAMIN C  Take 1 tablet (500 mg total) by mouth 2 (two) times daily.     aspirin 325 MG EC tablet  Take 1 tablet (325 mg total) by mouth daily.     ciprofloxacin 500 MG tablet  Commonly known as:  CIPRO  Take 1 tablet (500 mg total) by mouth 2 (two) times daily.     doxycycline 100 MG tablet  Commonly known as:  VIBRA-TABS  Take 100 mg by mouth 2 (two) times daily.     DSS 100 MG Caps  Take 100 mg by mouth 2 (two) times daily as needed for mild constipation.     ferrous sulfate 325  (65 FE) MG tablet  Take 1 tablet (325 mg total) by mouth 3 (three) times daily after meals.     Menthol (Topical Analgesic) 154 MG Pads  Apply 1 each topically once as needed (pain in knee.).     metoprolol tartrate 25 MG tablet  Commonly known as:  LOPRESSOR  Take 25 mg by mouth every morning.     pantoprazole 40 MG tablet  Commonly known as:  PROTONIX  Take 40 mg by mouth every morning.     saccharomyces boulardii 250 MG capsule  Commonly known as:  FLORASTOR  Take 250 mg by mouth every morning.     SYSTANE BALANCE OP  Apply 1-2 drops to eye once as needed (dry eyes.).     traMADol 50 MG tablet  Commonly known as:  ULTRAM  Take 2 tablets (100 mg total) by mouth every 6 (six) hours as needed for moderate pain.     Vitamin D3 2000 UNITS capsule  Take 2,000 Units by mouth every morning.     zinc sulfate 220 MG capsule  Take 1 capsule (220 mg total) by mouth daily.        Diagnostic Studies: Dg Hip Complete Left  12/23/2013   CLINICAL DATA:  Left hip replacement  EXAM: LEFT HIP - COMPLETE 2+ VIEW  COMPARISON:  None.  FINDINGS: C-arm spot films show the acetabular and femoral components of the left hip replacement in good position. No complicating features are seen.  IMPRESSION: Left total hip replacement.   Electronically Signed   By: Dwyane DeePaul  Barry M.D.   On: 12/23/2013 11:26   Dg Pelvis Portable  12/23/2013   CLINICAL DATA:  Postop left hip arthroplasty.  EXAM: PORTABLE PELVIS 1-2 VIEWS  COMPARISON:  Earlier same day.  FINDINGS: Exam demonstrates evidence of patient's recent total left hip arthroplasty as the prosthesis appears intact and normally located. Minimal air in the soft tissues adjacent the left hip compatible with recent surgery. There are moderate osteoarthritic changes of the right hip. There is mild diffuse decreased bone mineralization present.  IMPRESSION: Evidence of patient's recent left total hip arthroplasty without complicating features.  Moderate  osteoarthritis right hip.   Electronically Signed   By: Elberta Fortisaniel  Boyle M.D.   On: 12/23/2013 12:52   Dg Hip Portable 1 View Left  12/23/2013   CLINICAL DATA:  Left hip replacement.  EXAM: PORTABLE LEFT HIP - 1 VIEW  COMPARISON:  Postoperative view  of the pelvis.  FINDINGS: Cross-table lateral view of the left hip demonstrates changes of left hip replacement. Normal alignment. No bony complicating feature.  IMPRESSION: Left hip replacement.  No complicating feature.   Electronically Signed   By: Charlett NoseKevin  Dover M.D.   On: 12/23/2013 12:56   Dg C-arm 1-60 Min-no Report  12/29/2013   : Fluoroscopy was utilized by the requesting physician. No radiographic interpretation.   Electronically Signed   By: Leslye Peerad  Services   On: 12/29/2013 13:05    Disposition:  To home      Discharge Instructions    Call MD / Call 911    Complete by:  As directed   If you experience chest pain or shortness of breath, CALL 911 and be transported to the hospital emergency room.  If you develope a fever above 101 F, pus (white drainage) or increased drainage or redness at the wound, or calf pain, call your surgeon's office.     Constipation Prevention    Complete by:  As directed   Drink plenty of fluids.  Prune juice may be helpful.  You may use a stool softener, such as Colace (over the counter) 100 mg twice a day.  Use MiraLax (over the counter) for constipation as needed.     Diet - low sodium heart healthy    Complete by:  As directed      Discharge instructions    Complete by:  As directed   Increase activities as comfort allows. Keep left hip incision clean and dry. You can get your actual incision and sutures wet daily in the shower starting 01/14/14.     Discharge patient    Complete by:  As directed      Increase activity slowly as tolerated    Complete by:  As directed            Follow-up Information    Follow up with Kathryne HitchBLACKMAN,Jaedin Regina Y, MD. Schedule an appointment as soon as possible for a visit in  2 weeks.   Specialty:  Orthopedic Surgery   Contact information:   [address] [place] [address] [phone]        Signed: Kathryne HitchBLACKMAN,Margues Filippini Y 01/11/2014, 7:03 AM

## 2014-01-12 LAB — WOUND CULTURE

## 2014-01-14 LAB — ANAEROBIC CULTURE

## 2014-07-27 ENCOUNTER — Other Ambulatory Visit (HOSPITAL_COMMUNITY): Payer: Self-pay | Admitting: Orthopaedic Surgery

## 2014-08-14 NOTE — Patient Instructions (Addendum)
Gerhard PerchesBarbara Kassem  08/14/2014   Your procedure is scheduled on: Friday 08/18/14  [facility] and follow signs to               Short Stay Center at 07:45 AM.  Call this number if you have problems the morning of surgery [phone]   Remember: ONLY 1 PERSON MAY GO WITH YOU TO SHORT STAY TO GET  READY MORNING OF YOUR SURGERY.  Do not eat food or drink liquids :After Midnight.   Take these medicines the morning of surgery with A SIP OF WATER: metoprolol tartrate (lopressor), pantoprazole (protonix), eye drops if needed                               You may not have any metal on your body including hair pins and              piercings  Do not wear jewelry, make-up, lotions, powders or perfumes, deodorant             Do not wear nail polish.  Do not shave  48 hours prior to surgery.              Do not bring valuables to the hospital. [facility] IS NOT             RESPONSIBLE   FOR VALUABLES.  Contacts, dentures or bridgework may not be worn into surgery.  Leave suitcase in the car. After surgery it may be brought to your room.                  Please read over the following fact sheets you were given: MRSA information; Blood Transfusion Information Sheet  _____________________________________________________________________           [facility] - Pre[facility] Before surgery, you can play an important role.  Because skin is not sterile, your skin needs to be as free of germs as possible.  You can reduce the number of germs on your skin by washing with CHG (chlorahexidine gluconate) soap before surgery.  CHG is an antiseptic cleaner which kills germs and bonds with the skin to continue killing germs even after washing. Please DO NOT use if you have an allergy to CHG or antibacterial soaps.  If your skin becomes reddened/irritated stop using the CHG and inform your nurse when you arrive at Short Stay. Do not shave (including legs and  underarms) for at least 48 hours prior to the first CHG shower.  You may shave your face/neck. Please follow these instructions carefully:  1.  Shower with CHG Soap the night before surgery and the  morning of Surgery.  2.  If you choose to wash your hair, wash your hair first as usual with your  normal  shampoo.  3.  After you shampoo, rinse your hair and body thoroughly to remove the  shampoo.                            4.  Use CHG as you would any other liquid soap.  You can apply chg directly  to the skin and wash                       Gently with a scrungie  or clean washcloth.  5.  Apply the CHG Soap to your body ONLY FROM THE NECK DOWN.   Do not use on face/ open                           Wound or open sores. Avoid contact with eyes, ears mouth and genitals (private parts).                       Wash face,  Genitals (private parts) with your normal soap.             6.  Wash thoroughly, paying special attention to the area where your surgery  will be performed.  7.  Thoroughly rinse your body with warm water from the neck down.  8.  DO NOT shower/wash with your normal soap after using and rinsing off  the CHG Soap.                9.  Pat yourself dry with a clean towel.            10.  Wear clean pajamas.            11.  Place clean sheets on your bed the night of your first shower and do not  sleep with pets. Day of Surgery : Do not apply any lotions/deodorants the morning of surgery.  Please wear clean clothes to the hospital/surgery center.  FAILURE TO FOLLOW THESE INSTRUCTIONS MAY RESULT IN THE CANCELLATION OF YOUR SURGERY PATIENT SIGNATURE_________________________________  NURSE SIGNATURE__________________________________  ________________________________________________________________________

## 2014-08-14 NOTE — Progress Notes (Signed)
LOV note 09/13/13 [name] Kernshristine Thornsbury FNP on chart, EKG 2010 on chart-too old, will repeat at pre-op visit, ECHO 10/03/13 on chart

## 2014-08-15 ENCOUNTER — Encounter (HOSPITAL_COMMUNITY): Payer: Self-pay

## 2014-08-15 ENCOUNTER — Encounter (HOSPITAL_COMMUNITY)
Admission: RE | Admit: 2014-08-15 | Discharge: 2014-08-15 | Disposition: A | Discharge status: Home / Self Care | Payer: Medicare Other | Source: Ambulatory Visit | Attending: Orthopaedic Surgery | Admitting: Orthopaedic Surgery

## 2014-08-15 DIAGNOSIS — Z01818 Encounter for other preprocedural examination: Secondary | ICD-10-CM

## 2014-08-15 DIAGNOSIS — M1611 Unilateral primary osteoarthritis, right hip: Secondary | ICD-10-CM

## 2014-08-15 HISTORY — DX: Gastro-esophageal reflux disease without esophagitis: K21.9

## 2014-08-15 HISTORY — DX: Cardiac murmur, unspecified: R01.1

## 2014-08-15 HISTORY — DX: Unspecified asthma, uncomplicated: J45.909

## 2014-08-15 LAB — SURGICAL PCR SCREEN
MRSA, PCR: NEGATIVE
Staphylococcus aureus: NEGATIVE

## 2014-08-15 LAB — CBC
HCT: 42.6 % (ref 36.0–46.0)
Hemoglobin: 13.7 g/dL (ref 12.0–15.0)
MCH: 31.3 pg (ref 26.0–34.0)
MCHC: 32.2 g/dL (ref 30.0–36.0)
MCV: 97.3 fL (ref 78.0–100.0)
Platelets: 242 10*3/uL (ref 150–400)
RBC: 4.38 MIL/uL (ref 3.87–5.11)
RDW: 14.1 % (ref 11.5–15.5)
WBC: 7.3 10*3/uL (ref 4.0–10.5)

## 2014-08-15 LAB — BASIC METABOLIC PANEL
Anion gap: 11 (ref 5–15)
BUN: 23 mg/dL — ABNORMAL HIGH (ref 6–20)
CO2: 24 mmol/L (ref 22–32)
Calcium: 9.5 mg/dL (ref 8.9–10.3)
Chloride: 103 mmol/L (ref 101–111)
Creatinine, Ser: 1.06 mg/dL — ABNORMAL HIGH (ref 0.44–1.00)
GFR calc Af Amer: 55 mL/min — ABNORMAL LOW (ref >60)
GFR calc non Af Amer: 47 mL/min — ABNORMAL LOW (ref >60)
Glucose, Bld: 86 mg/dL (ref 65–99)
Potassium: 4.4 mmol/L (ref 3.5–5.1)
Sodium: 138 mmol/L (ref 135–145)

## 2014-08-15 LAB — PROTIME-INR
INR: 1.15 (ref 0.00–1.49)
Prothrombin Time: 14.9 seconds (ref 11.6–15.2)

## 2014-08-15 LAB — APTT: aPTT: 34 seconds (ref 24–37)

## 2014-08-15 NOTE — Progress Notes (Signed)
BMP results per epic per PAT visit 08/15/2014 sent to Dr Maureen Ralphs Blackman

## 2014-08-15 NOTE — Progress Notes (Signed)
Dr Acey Lavarignan / Anesthesia reviewed EKG's, H&P and ECHO. Anesthesia to see pt day of surgery.

## 2014-08-18 ENCOUNTER — Inpatient Hospital Stay (HOSPITAL_COMMUNITY): Payer: Medicare Other

## 2014-08-18 ENCOUNTER — Inpatient Hospital Stay (HOSPITAL_COMMUNITY)
Admission: RE | Admit: 2014-08-18 | Discharge: 2014-08-21 | DRG: 554 | Disposition: A | Discharge status: Skilled Nursing Facility | Payer: Medicare Other | Source: Ambulatory Visit | Attending: Orthopaedic Surgery | Admitting: Orthopaedic Surgery

## 2014-08-18 ENCOUNTER — Inpatient Hospital Stay (HOSPITAL_COMMUNITY): Payer: Medicare Other | Admitting: Anesthesiology

## 2014-08-18 ENCOUNTER — Encounter (HOSPITAL_COMMUNITY): Payer: Self-pay | Admitting: *Deleted

## 2014-08-18 ENCOUNTER — Encounter (HOSPITAL_COMMUNITY)
Admission: RE | Disposition: A | Discharge status: Skilled Nursing Facility | Payer: Self-pay | Source: Ambulatory Visit | Attending: Orthopaedic Surgery

## 2014-08-18 DIAGNOSIS — Z880 Allergy status to penicillin: Secondary | ICD-10-CM

## 2014-08-18 DIAGNOSIS — K219 Gastro-esophageal reflux disease without esophagitis: Secondary | ICD-10-CM | POA: Diagnosis present

## 2014-08-18 DIAGNOSIS — I10 Essential (primary) hypertension: Secondary | ICD-10-CM | POA: Diagnosis present

## 2014-08-18 DIAGNOSIS — Z8711 Personal history of peptic ulcer disease: Secondary | ICD-10-CM

## 2014-08-18 DIAGNOSIS — Z01812 Encounter for preprocedural laboratory examination: Secondary | ICD-10-CM | POA: Diagnosis not present

## 2014-08-18 DIAGNOSIS — M160 Bilateral primary osteoarthritis of hip: Secondary | ICD-10-CM | POA: Diagnosis present

## 2014-08-18 DIAGNOSIS — M1611 Unilateral primary osteoarthritis, right hip: Secondary | ICD-10-CM

## 2014-08-18 DIAGNOSIS — Z86010 Personal history of colonic polyps: Secondary | ICD-10-CM | POA: Diagnosis not present

## 2014-08-18 DIAGNOSIS — M25551 Pain in right hip: Secondary | ICD-10-CM | POA: Diagnosis present

## 2014-08-18 DIAGNOSIS — D62 Acute posthemorrhagic anemia: Secondary | ICD-10-CM | POA: Diagnosis not present

## 2014-08-18 DIAGNOSIS — Z419 Encounter for procedure for purposes other than remedying health state, unspecified: Secondary | ICD-10-CM

## 2014-08-18 DIAGNOSIS — Z885 Allergy status to narcotic agent status: Secondary | ICD-10-CM | POA: Diagnosis not present

## 2014-08-18 DIAGNOSIS — Z96641 Presence of right artificial hip joint: Secondary | ICD-10-CM

## 2014-08-18 DIAGNOSIS — J45909 Unspecified asthma, uncomplicated: Secondary | ICD-10-CM | POA: Diagnosis present

## 2014-08-18 DIAGNOSIS — Z01810 Encounter for preprocedural cardiovascular examination: Secondary | ICD-10-CM | POA: Diagnosis not present

## 2014-08-18 DIAGNOSIS — Z952 Presence of prosthetic heart valve: Secondary | ICD-10-CM

## 2014-08-18 DIAGNOSIS — Z96642 Presence of left artificial hip joint: Secondary | ICD-10-CM | POA: Diagnosis present

## 2014-08-18 HISTORY — PX: TOTAL HIP ARTHROPLASTY: SHX124

## 2014-08-18 LAB — TYPE AND SCREEN
ABO/RH(D): A NEG
Antibody Screen: NEGATIVE

## 2014-08-18 SURGERY — ARTHROPLASTY, HIP, TOTAL, ANTERIOR APPROACH
Anesthesia: Spinal | Site: Hip | Laterality: Right

## 2014-08-18 MED ORDER — OXYCODONE HCL 5 MG PO TABS
5.0000 mg | ORAL_TABLET | ORAL | Status: DC | PRN
  Administered 2014-08-18: 5 mg via ORAL
  Administered 2014-08-18 – 2014-08-21 (×8): 10 mg via ORAL
  Filled 2014-08-18 (×8): qty 2
  Filled 2014-08-18: qty 1

## 2014-08-18 MED ORDER — ZINC SULFATE 220 (50 ZN) MG PO CAPS
220.0000 mg | ORAL_CAPSULE | Freq: Every day | ORAL | Status: DC
  Administered 2014-08-18 – 2014-08-21 (×4): 220 mg via ORAL
  Filled 2014-08-18 (×4): qty 1

## 2014-08-18 MED ORDER — CLINDAMYCIN PHOSPHATE 900 MG/50ML IV SOLN
900.0000 mg | INTRAVENOUS | Status: AC
  Administered 2014-08-18: 900 mg via INTRAVENOUS

## 2014-08-18 MED ORDER — PHENYLEPHRINE HCL 10 MG/ML IJ SOLN
INTRAMUSCULAR | Status: AC
  Filled 2014-08-18: qty 1

## 2014-08-18 MED ORDER — VITAMIN D3 50 MCG (2000 UT) PO CAPS: 2000.0000 [IU] | ORAL_CAPSULE | ORAL | Status: DC

## 2014-08-18 MED ORDER — TRANEXAMIC ACID 1000 MG/10ML IV SOLN
1000.0000 mg | INTRAVENOUS | Status: AC
  Administered 2014-08-18: 1000 mg via INTRAVENOUS
  Filled 2014-08-18: qty 10

## 2014-08-18 MED ORDER — VITAMIN C 500 MG PO TABS
500.0000 mg | ORAL_TABLET | Freq: Two times a day (BID) | ORAL | Status: DC
  Filled 2014-08-18 (×2): qty 1

## 2014-08-18 MED ORDER — CLINDAMYCIN PHOSPHATE 600 MG/50ML IV SOLN
600.0000 mg | Freq: Four times a day (QID) | INTRAVENOUS | Status: AC
  Administered 2014-08-18 (×2): 600 mg via INTRAVENOUS
  Filled 2014-08-18 (×2): qty 50

## 2014-08-18 MED ORDER — DOCUSATE SODIUM 100 MG PO CAPS
100.0000 mg | ORAL_CAPSULE | Freq: Two times a day (BID) | ORAL | Status: DC
  Administered 2014-08-18 – 2014-08-21 (×6): 100 mg via ORAL
  Filled 2014-08-18 (×5): qty 1

## 2014-08-18 MED ORDER — HYDROMORPHONE HCL 1 MG/ML IJ SOLN: 0.2500 mg | INTRAMUSCULAR | Status: DC | PRN

## 2014-08-18 MED ORDER — METOCLOPRAMIDE HCL 10 MG PO TABS: 5.0000 mg | ORAL_TABLET | Freq: Three times a day (TID) | ORAL | Status: DC | PRN

## 2014-08-18 MED ORDER — METOPROLOL TARTRATE 25 MG PO TABS
25.0000 mg | ORAL_TABLET | Freq: Every day | ORAL | Status: DC
  Administered 2014-08-18 – 2014-08-20 (×3): 25 mg via ORAL
  Filled 2014-08-18 (×4): qty 1

## 2014-08-18 MED ORDER — SODIUM CHLORIDE 0.9 % IR SOLN
Status: DC | PRN
  Administered 2014-08-18: 1000 mL

## 2014-08-18 MED ORDER — HYDROMORPHONE HCL 1 MG/ML IJ SOLN
0.5000 mg | INTRAMUSCULAR | Status: DC | PRN
  Administered 2014-08-18 – 2014-08-19 (×3): 0.5 mg via INTRAVENOUS
  Filled 2014-08-18 (×3): qty 1

## 2014-08-18 MED ORDER — LACTATED RINGERS IV SOLN
INTRAVENOUS | Status: DC
  Administered 2014-08-18: 1000 mL via INTRAVENOUS
  Administered 2014-08-18: 10:00:00 via INTRAVENOUS

## 2014-08-18 MED ORDER — PROPOFOL 10 MG/ML IV BOLUS
INTRAVENOUS | Status: AC
  Filled 2014-08-18: qty 20

## 2014-08-18 MED ORDER — MIDAZOLAM HCL 5 MG/5ML IJ SOLN
INTRAMUSCULAR | Status: DC | PRN
  Administered 2014-08-18 (×2): 1 mg via INTRAVENOUS

## 2014-08-18 MED ORDER — METHOCARBAMOL 500 MG PO TABS
500.0000 mg | ORAL_TABLET | Freq: Four times a day (QID) | ORAL | Status: DC | PRN
  Administered 2014-08-19 – 2014-08-21 (×5): 500 mg via ORAL
  Filled 2014-08-18 (×5): qty 1

## 2014-08-18 MED ORDER — LIDOCAINE HCL (CARDIAC) 20 MG/ML IV SOLN
INTRAVENOUS | Status: AC
  Filled 2014-08-18: qty 5

## 2014-08-18 MED ORDER — MIDAZOLAM HCL 2 MG/2ML IJ SOLN
INTRAMUSCULAR | Status: AC
  Filled 2014-08-18: qty 2

## 2014-08-18 MED ORDER — MENTHOL 3 MG MT LOZG: 1.0000 | LOZENGE | OROMUCOSAL | Status: DC | PRN

## 2014-08-18 MED ORDER — EPHEDRINE SULFATE 50 MG/ML IJ SOLN
INTRAMUSCULAR | Status: AC
  Filled 2014-08-18: qty 1

## 2014-08-18 MED ORDER — FENTANYL CITRATE (PF) 100 MCG/2ML IJ SOLN
INTRAMUSCULAR | Status: DC | PRN
  Administered 2014-08-18: 50 ug via INTRAVENOUS

## 2014-08-18 MED ORDER — PHENYLEPHRINE HCL 10 MG/ML IJ SOLN
10.0000 mg | INTRAVENOUS | Status: DC | PRN
  Administered 2014-08-18: 10 ug/min via INTRAVENOUS

## 2014-08-18 MED ORDER — PHENYLEPHRINE 40 MCG/ML (10ML) SYRINGE FOR IV PUSH (FOR BLOOD PRESSURE SUPPORT)
PREFILLED_SYRINGE | INTRAVENOUS | Status: AC
  Filled 2014-08-18: qty 10

## 2014-08-18 MED ORDER — OXYCODONE HCL 5 MG PO TABS: 5.0000 mg | ORAL_TABLET | Freq: Once | ORAL | Status: DC | PRN

## 2014-08-18 MED ORDER — VITAMIN C 500 MG PO TABS
500.0000 mg | ORAL_TABLET | Freq: Two times a day (BID) | ORAL | Status: DC
  Administered 2014-08-18 – 2014-08-21 (×6): 500 mg via ORAL
  Filled 2014-08-18 (×7): qty 1

## 2014-08-18 MED ORDER — PANTOPRAZOLE SODIUM 40 MG PO TBEC
40.0000 mg | DELAYED_RELEASE_TABLET | Freq: Every day | ORAL | Status: DC
  Administered 2014-08-19 – 2014-08-21 (×3): 40 mg via ORAL
  Filled 2014-08-18 (×3): qty 1

## 2014-08-18 MED ORDER — VITAMIN D3 25 MCG (1000 UNIT) PO TABS
2000.0000 [IU] | ORAL_TABLET | Freq: Every day | ORAL | Status: DC
  Administered 2014-08-19 – 2014-08-21 (×3): 2000 [IU] via ORAL
  Filled 2014-08-18 (×3): qty 2

## 2014-08-18 MED ORDER — DIPHENHYDRAMINE HCL 12.5 MG/5ML PO ELIX: 12.5000 mg | ORAL_SOLUTION | ORAL | Status: DC | PRN

## 2014-08-18 MED ORDER — SACCHAROMYCES BOULARDII 250 MG PO CAPS
250.0000 mg | ORAL_CAPSULE | Freq: Every day | ORAL | Status: DC
  Administered 2014-08-19 – 2014-08-21 (×3): 250 mg via ORAL
  Filled 2014-08-18 (×3): qty 1

## 2014-08-18 MED ORDER — PROMETHAZINE HCL 25 MG/ML IJ SOLN: 6.2500 mg | INTRAMUSCULAR | Status: DC | PRN

## 2014-08-18 MED ORDER — SODIUM CHLORIDE 0.9 % IJ SOLN
INTRAMUSCULAR | Status: AC
  Filled 2014-08-18: qty 10

## 2014-08-18 MED ORDER — PHENYLEPHRINE HCL 10 MG/ML IJ SOLN
INTRAMUSCULAR | Status: DC | PRN
  Administered 2014-08-18 (×3): 40 ug via INTRAVENOUS

## 2014-08-18 MED ORDER — ONDANSETRON HCL 4 MG/2ML IJ SOLN
4.0000 mg | Freq: Four times a day (QID) | INTRAMUSCULAR | Status: DC | PRN
  Administered 2014-08-19 (×2): 4 mg via INTRAVENOUS
  Filled 2014-08-18 (×2): qty 2

## 2014-08-18 MED ORDER — METOCLOPRAMIDE HCL 5 MG/ML IJ SOLN: 5.0000 mg | Freq: Three times a day (TID) | INTRAMUSCULAR | Status: DC | PRN

## 2014-08-18 MED ORDER — FENTANYL CITRATE (PF) 100 MCG/2ML IJ SOLN
INTRAMUSCULAR | Status: AC
  Filled 2014-08-18: qty 2

## 2014-08-18 MED ORDER — ONDANSETRON HCL 4 MG/2ML IJ SOLN
INTRAMUSCULAR | Status: DC | PRN
  Administered 2014-08-18: 4 mg via INTRAVENOUS

## 2014-08-18 MED ORDER — PROPOFOL INFUSION 10 MG/ML OPTIME
INTRAVENOUS | Status: DC | PRN
  Administered 2014-08-18: 100 ug/kg/min via INTRAVENOUS

## 2014-08-18 MED ORDER — OXYCODONE HCL 5 MG/5ML PO SOLN
5.0000 mg | Freq: Once | ORAL | Status: DC | PRN
  Filled 2014-08-18: qty 5

## 2014-08-18 MED ORDER — ACETAMINOPHEN 325 MG PO TABS
650.0000 mg | ORAL_TABLET | Freq: Four times a day (QID) | ORAL | Status: DC | PRN
Start: 2014-08-18 — End: 2014-08-21
  Administered 2014-08-18: 650 mg via ORAL
  Filled 2014-08-18: qty 2

## 2014-08-18 MED ORDER — FERROUS SULFATE 325 (65 FE) MG PO TABS
325.0000 mg | ORAL_TABLET | Freq: Every morning | ORAL | Status: DC
  Administered 2014-08-18 – 2014-08-21 (×4): 325 mg via ORAL
  Filled 2014-08-18 (×4): qty 1

## 2014-08-18 MED ORDER — SODIUM CHLORIDE 0.9 % IV SOLN
INTRAVENOUS | Status: DC
  Administered 2014-08-18 – 2014-08-19 (×2): via INTRAVENOUS

## 2014-08-18 MED ORDER — METHOCARBAMOL 1000 MG/10ML IJ SOLN
500.0000 mg | Freq: Four times a day (QID) | INTRAVENOUS | Status: DC | PRN
  Administered 2014-08-18: 500 mg via INTRAVENOUS
  Filled 2014-08-18 (×2): qty 5

## 2014-08-18 MED ORDER — PHENOL 1.4 % MT LIQD: 1.0000 | OROMUCOSAL | Status: DC | PRN

## 2014-08-18 MED ORDER — ALUM & MAG HYDROXIDE-SIMETH 200-200-20 MG/5ML PO SUSP: 30.0000 mL | ORAL | Status: DC | PRN

## 2014-08-18 MED ORDER — ONDANSETRON HCL 4 MG PO TABS
4.0000 mg | ORAL_TABLET | Freq: Four times a day (QID) | ORAL | Status: DC | PRN
  Filled 2014-08-18: qty 1

## 2014-08-18 MED ORDER — ACETAMINOPHEN 650 MG RE SUPP: 650.0000 mg | Freq: Four times a day (QID) | RECTAL | Status: DC | PRN

## 2014-08-18 MED ORDER — CLINDAMYCIN PHOSPHATE 900 MG/50ML IV SOLN
INTRAVENOUS | Status: AC
  Filled 2014-08-18: qty 50

## 2014-08-18 MED ORDER — ASPIRIN EC 325 MG PO TBEC
325.0000 mg | DELAYED_RELEASE_TABLET | Freq: Two times a day (BID) | ORAL | Status: DC
  Administered 2014-08-18 – 2014-08-21 (×6): 325 mg via ORAL
  Filled 2014-08-18 (×8): qty 1

## 2014-08-18 SURGICAL SUPPLY — 43 items
APL SKNCLS STERI-STRIP NONHPOA (GAUZE/BANDAGES/DRESSINGS)
BAG SPEC THK2 15X12 ZIP CLS (MISCELLANEOUS)
BAG ZIPLOCK 12X15 (MISCELLANEOUS) IMPLANT
BENZOIN TINCTURE PRP APPL 2/3 (GAUZE/BANDAGES/DRESSINGS) IMPLANT
BLADE SAW SGTL 18X1.27X75 (BLADE) ×2 IMPLANT
CAPT HIP TOTAL 2 ×1 IMPLANT
CELLS DAT CNTRL 66122 CELL SVR (MISCELLANEOUS) ×1 IMPLANT
COVER PERINEAL POST (MISCELLANEOUS) ×2 IMPLANT
DRAPE C-ARM 42X120 X-RAY (DRAPES) ×2 IMPLANT
DRAPE STERI IOBAN 125X83 (DRAPES) ×2 IMPLANT
DRAPE U-SHAPE 47X51 STRL (DRAPES) ×6 IMPLANT
DRSG AQUACEL AG ADV 3.5X10 (GAUZE/BANDAGES/DRESSINGS) ×2 IMPLANT
DURAPREP 26ML APPLICATOR (WOUND CARE) ×2 IMPLANT
ELECT BLADE TIP CTD 4 INCH (ELECTRODE) ×2 IMPLANT
ELECT REM PT RETURN 9FT ADLT (ELECTROSURGICAL) ×2
ELECTRODE REM PT RTRN 9FT ADLT (ELECTROSURGICAL) ×1 IMPLANT
FACESHIELD WRAPAROUND (MASK) ×6 IMPLANT
FACESHIELD WRAPAROUND OR TEAM (MASK) ×4 IMPLANT
GAUZE XEROFORM 1X8 LF (GAUZE/BANDAGES/DRESSINGS) ×1 IMPLANT
GLOVE BIO SURGEON STRL SZ7.5 (GLOVE) ×2 IMPLANT
GLOVE BIOGEL PI IND STRL 8 (GLOVE) ×2 IMPLANT
GLOVE BIOGEL PI INDICATOR 8 (GLOVE) ×2
GLOVE ECLIPSE 8.0 STRL XLNG CF (GLOVE) ×2 IMPLANT
GOWN STRL REUS W/TWL XL LVL3 (GOWN DISPOSABLE) ×4 IMPLANT
HANDPIECE INTERPULSE COAX TIP (DISPOSABLE) ×2
KIT BASIN OR (CUSTOM PROCEDURE TRAY) ×2 IMPLANT
PACK TOTAL JOINT (CUSTOM PROCEDURE TRAY) ×2 IMPLANT
PEN SKIN MARKING BROAD (MISCELLANEOUS) ×2 IMPLANT
RETRACTOR WND ALEXIS 18 MED (MISCELLANEOUS) ×1 IMPLANT
RTRCTR WOUND ALEXIS 18CM MED (MISCELLANEOUS) ×2
SET HNDPC FAN SPRY TIP SCT (DISPOSABLE) ×1 IMPLANT
STAPLER VISISTAT 35W (STAPLE) ×1 IMPLANT
STRIP CLOSURE SKIN 1/2X4 (GAUZE/BANDAGES/DRESSINGS) IMPLANT
SUT ETHIBOND NAB CT1 #1 30IN (SUTURE) ×2 IMPLANT
SUT MNCRL AB 4-0 PS2 18 (SUTURE) IMPLANT
SUT VIC AB 0 CT1 36 (SUTURE) ×2 IMPLANT
SUT VIC AB 1 CT1 36 (SUTURE) ×2 IMPLANT
SUT VIC AB 2-0 CT1 27 (SUTURE) ×4
SUT VIC AB 2-0 CT1 TAPERPNT 27 (SUTURE) ×2 IMPLANT
TOWEL OR 17X26 10 PK STRL BLUE (TOWEL DISPOSABLE) ×2 IMPLANT
TOWEL OR NON WOVEN STRL DISP B (DISPOSABLE) ×2 IMPLANT
TRAY FOLEY W/METER SILVER 14FR (SET/KITS/TRAYS/PACK) ×2 IMPLANT
YANKAUER SUCT BULB TIP 10FT TU (MISCELLANEOUS) ×2 IMPLANT

## 2014-08-18 NOTE — Anesthesia Procedure Notes (Signed)
Spinal Patient location during procedure: OR Start time: 08/18/2014 9:42 AM End time: 08/18/2014 9:49 AM Staffing Resident/CRNA: Veva Holes G Performed by: resident/CRNA  Preanesthetic Checklist Completed: patient identified, site marked, surgical consent, pre-op evaluation, timeout performed, IV checked, risks and benefits discussed and monitors and equipment checked Spinal Block Patient position: sitting Prep: Betadine Patient monitoring: heart rate, continuous pulse ox and blood pressure Approach: midline Location: L2-3 Injection technique: single-shot Needle Needle type: Sprotte  Needle gauge: 24 G Needle length: 10 cm Needle insertion depth: 5 cm Assessment Sensory level: T6 Additional Notes Spinal tray lot #[id number] exp. Date 12-2015

## 2014-08-18 NOTE — Anesthesia Postprocedure Evaluation (Signed)
  Anesthesia Post-op Note  Patient: [name]  Procedure(s) Performed: Procedure(s): RIGHT TOTAL HIP ARTHROPLASTY ANTERIOR APPROACH (Right)  Patient Location: PACU  Anesthesia Type:Spinal  Level of Consciousness: awake, alert  and oriented  Airway and Oxygen Therapy: Patient Spontanous Breathing  Post-op Pain: mild  Post-op Assessment: Post-op Vital signs reviewed LLE Motor Response: Purposeful movement LLE Sensation: Tingling RLE Motor Response: Purposeful movement RLE Sensation: Tingling L Sensory Level: S1-Sole of foot, small toes R Sensory Level: S1-Sole of foot, small toes  Post-op Vital Signs: Reviewed  Last Vitals:  Filed Vitals:   08/18/14 1440  BP: 136/53  Pulse: 67  Temp: 36.8 C  Resp: 18    Complications: No apparent anesthesia complications

## 2014-08-18 NOTE — Transfer of Care (Signed)
Immediate Anesthesia Transfer of Care Note  Patient: [name]  Procedure(s) Performed: Procedure(s): RIGHT TOTAL HIP ARTHROPLASTY ANTERIOR APPROACH (Right)  Patient Location: PACU  Anesthesia Type:Spinal  Level of Consciousness: awake, alert  and oriented  Airway & Oxygen Therapy: Patient Spontanous Breathing and Patient connected to face mask oxygen  Post-op Assessment: Report given to RN and Post -op Vital signs reviewed and stable  Post vital signs: Reviewed and stable  Last Vitals:  Filed Vitals:   08/18/14 0747  BP: 150/73  Pulse: 74  Temp: 36.5 C  Resp: 16    Complications: No apparent anesthesia complications

## 2014-08-18 NOTE — Evaluation (Signed)
Physical Therapy Evaluation Patient Details Name: [name] MRN: [id number] DOB: April 14, 1928 Today's Date: 08/18/2014   History of Present Illness  R THR; s/p L THR 10/15  Clinical Impression  Pt s/p R THR presents with decreased R LE strength/ROM and post op pain limiting functional mobility.  Pt would benefit from follow up rehab at SNF level to maximize IND and safety prior to return home ALONE.    Follow Up Recommendations SNF    Equipment Recommendations  None recommended by PT    Recommendations for Other Services OT consult     Precautions / Restrictions Precautions Precautions: Fall Restrictions Weight Bearing Restrictions: No Other Position/Activity Restrictions: WBAT      Mobility  Bed Mobility Overal bed mobility: Needs Assistance Bed Mobility: Supine to Sit;Sit to Supine     Supine to sit: Min assist Sit to supine: Min assist;Mod assist   General bed mobility comments: cues for sequence and use of L LE to self assist  Transfers Overall transfer level: Needs assistance Equipment used: Rolling walker (2 wheeled) Transfers: Sit to/from Stand Sit to Stand: Min assist;Mod assist         General transfer comment: cues for LE management and use of UEs to self assist  Ambulation/Gait Ambulation/Gait assistance: Min assist Ambulation Distance (Feet): 48 Feet Assistive device: Rolling walker (2 wheeled) Gait Pattern/deviations: Step-to pattern;Decreased step length - right;Decreased step length - left;Shuffle;Trunk flexed Gait velocity: decr   General Gait Details: cues for posture, position from RW and initial sequence  Stairs            Wheelchair Mobility    Modified Rankin (Stroke Patients Only)       Balance                                             Pertinent Vitals/Pain Pain Assessment: 0-10 Pain Score: 4  Pain Location: R hip Pain Descriptors / Indicators: Aching;Sore;Tightness Pain Intervention(s):  Limited activity within patient's tolerance;Monitored during session;Premedicated before session;Ice applied    Home Living Family/patient expects to be discharged to:: Skilled nursing facility Living Arrangements: Alone                    Prior Function Level of Independence: Independent with assistive device(s)         Comments: used 4 wheeled walker prior to admission and AE for LB ADL     Hand Dominance   Dominant Hand: Right    Extremity/Trunk Assessment   Upper Extremity Assessment: Overall WFL for tasks assessed           Lower Extremity Assessment: RLE deficits/detail      Cervical / Trunk Assessment: Normal  Communication   Communication: No difficulties  Cognition Arousal/Alertness: Awake/alert Behavior During Therapy: WFL for tasks assessed/performed Overall Cognitive Status: Within Functional Limits for tasks assessed                      General Comments      Exercises Total Joint Exercises Ankle Circles/Pumps: AROM;Both;15 reps;Supine      Assessment/Plan    PT Assessment Patient needs continued PT services  PT Diagnosis Difficulty walking   PT Problem List Decreased strength;Decreased range of motion;Decreased activity tolerance;Decreased mobility;Decreased knowledge of use of DME;Pain  PT Treatment Interventions DME instruction;Gait training;Functional mobility training;Stair training;Therapeutic activities;Therapeutic exercise;Patient/family education   PT  Goals (Current goals can be found in the Care Plan section) Acute Rehab PT Goals Patient Stated Goal: Rehab and then home to resume previous lifestyle with decreased pain PT Goal Formulation: With patient Time For Goal Achievement: 08/25/14 Potential to Achieve Goals: Good    Frequency 7X/week   Barriers to discharge        Co-evaluation               End of Session Equipment Utilized During Treatment: Gait belt Activity Tolerance: Patient tolerated  treatment well Patient left: with call bell/phone within reach;in bed Nurse Communication: Mobility status         Time: 8295-6213 PT Time Calculation (min) (ACUTE ONLY): 20 min   Charges:   PT Evaluation $Initial PT Evaluation Tier I: 1 Procedure     PT G Codes:        [name] Sep 12, 2014, 5:21 PM

## 2014-08-18 NOTE — Brief Op Note (Signed)
08/18/2014  11:02 AM  PATIENT:  Gerhard Perches  81 y.o. female  PRE-OPERATIVE DIAGNOSIS:  right hip osteoarthritis  POST-OPERATIVE DIAGNOSIS:  right hip osteoarthritis  PROCEDURE:  Procedure(s): RIGHT TOTAL HIP ARTHROPLASTY ANTERIOR APPROACH (Right)  SURGEON:  Surgeon(s) and Role:    * Kathryne Hitch, MD - Primary  PHYSICIAN ASSISTANT: Rexene Edison, PA-C  ANESTHESIA:   spinal  EBL:  Total I/O In: 1000 [I.V.:1000] Out: 450 [Urine:200; Blood:250]  BLOOD ADMINISTERED:none  DRAINS: none   LOCAL MEDICATIONS USED:  NONE  SPECIMEN:  No Specimen  DISPOSITION OF SPECIMEN:  N/A  COUNTS:  YES  TOURNIQUET:  * No tourniquets in log *  DICTATION: .Other Dictation: Dictation Number [id number]  PLAN OF CARE: Admit to inpatient   PATIENT DISPOSITION:  PACU - hemodynamically stable.   Delay start of Pharmacological VTE agent (>24hrs) due to surgical blood loss or risk of bleeding: no

## 2014-08-18 NOTE — Plan of Care (Signed)
Problem: Consults Goal: Diagnosis- Total Joint Replacement Right anterior hip     

## 2014-08-18 NOTE — Anesthesia Preprocedure Evaluation (Addendum)
Anesthesia Evaluation  Patient identified by MRN, date of birth, ID band Patient awake    Reviewed: Allergy & Precautions, H&P , NPO status , Patient's Chart, lab work & pertinent test results, reviewed documented beta blocker date and time   Airway Mallampati: II  TM Distance: >3 FB Neck ROM: Full    Dental no notable dental hx.    Pulmonary neg pulmonary ROS,  breath sounds clear to auscultation  Pulmonary exam normal       Cardiovascular hypertension, Pt. on medications and Pt. on home beta blockers Normal cardiovascular exam+ Valvular Problems/Murmurs (s/p AVR 2014 Tissue valve) Rhythm:Regular Rate:Normal     Neuro/Psych negative neurological ROS     GI/Hepatic Neg liver ROS, PUD, GERD-  ,  Endo/Other  negative endocrine ROS  Renal/GU negative Renal ROS     Musculoskeletal  (+) Arthritis -,   Abdominal   Peds negative pediatric ROS (+)  Hematology negative hematology ROS (+)   Anesthesia Other Findings   Reproductive/Obstetrics                            Lab Results  Component Value Date   WBC 7.3 08/15/2014   HGB 13.7 08/15/2014   HCT 42.6 08/15/2014   MCV 97.3 08/15/2014   PLT 242 08/15/2014   Lab Results  Component Value Date   CREATININE 1.06* 08/15/2014   BUN 23* 08/15/2014   NA 138 08/15/2014   K 4.4 08/15/2014   CL 103 08/15/2014   CO2 24 08/15/2014    Anesthesia Physical  Anesthesia Plan  ASA: III  Anesthesia Plan: Spinal   Post-op Pain Management:    Induction:   Airway Management Planned: Simple Face Mask  Additional Equipment:   Intra-op Plan:   Post-operative Plan:   Informed Consent: I have reviewed the patients History and Physical, chart, labs and discussed the procedure including the risks, benefits and alternatives for the proposed anesthesia with the patient or authorized representative who has indicated his/her understanding and acceptance.    Dental advisory given  Plan Discussed with: CRNA  Anesthesia Plan Comments:         Anesthesia Quick Evaluation

## 2014-08-18 NOTE — H&P (Signed)
TOTAL HIP ADMISSION H&P  Patient is admitted for right total hip arthroplasty.  Subjective:  Chief Complaint: right hip pain  HPI: [name], 81 y.o. female, has a history of pain and functional disability in the right hip(s) due to arthritis and patient has failed non-surgical conservative treatments for greater than 12 weeks to include NSAID's and/or analgesics, corticosteriod injections, flexibility and strengthening excercises, supervised PT with diminished ADL's post treatment, use of assistive devices and activity modification.  Onset of symptoms was gradual starting 1 years ago with rapidlly worsening course since that time.The patient noted no past surgery on the right hip(s).  Patient currently rates pain in the right hip at 10 out of 10 with activity. Patient has night pain, worsening of pain with activity and weight bearing, pain that interfers with activities of daily living and pain with passive range of motion. Patient has evidence of subchondral sclerosis, periarticular osteophytes and joint space narrowing by imaging studies. This condition presents safety issues increasing the risk of falls.  There is no current active infection.  Patient Active Problem List   Diagnosis Date Noted  . Osteoarthritis of right hip 08/18/2014  . Postoperative wound infection left hip 01/09/2014  . Post-operative infection 01/09/2014  . Postoperative anemia due to acute blood loss 12/28/2013  . HTN (hypertension) 12/28/2013  . Duodenal ulcer 12/28/2013  . Arthritis of left hip 12/23/2013  . Status post total replacement of left hip 12/23/2013  . Hematemesis 01/18/2013  . Acute duodenal ulcer with hemorrhage 01/18/2013  . Acute gastric ulcers with hemorrhage 01/18/2013  . GI bleed 01/17/2013  . H/O aortic valve replacement 01/17/2013   Past Medical History  Diagnosis Date  . Hypertension   . Pneumonia   . Arthritis     left hip  . Colon polyps     type not known, year(s) of occurrence  not defined.   . Acute duodenal ulcer with hemorrhage 01/18/2013  . Acute gastric ulcers with hemorrhage 01/18/2013  . Aortic valve replaced   . Nocturia   . Anemia   . History of transfusion   . Heart murmur   . Asthma     hx of in childhood   . GERD (gastroesophageal reflux disease)     Past Surgical History  Procedure Laterality Date  . Tissue aortic valve replacement  Nov 18 2012    at [facility]  . Dilation and curettage of uterus      x 2.  . Cataract extraction Bilateral   . Esophagogastroduodenoscopy N/A 01/18/2013    Procedure: ESOPHAGOGASTRODUODENOSCOPY (EGD);  Surgeon: Iva Boop, MD;  Location: [facility] ENDOSCOPY;  Service: Endoscopy;  Laterality: N/A;  . Total hip arthroplasty Left 12/23/2013    Procedure: TOTAL HIP ARTHROPLASTY ANTERIOR APPROACH, LEFT;  Surgeon: Kathryne Hitch, MD;  Location: [facility] ORS;  Service: Orthopedics;  Laterality: Left;  . Incision and drainage hip Left 01/09/2014    Procedure: IRRIGATION AND DEBRIDEMENT LEFT HIP;  Surgeon: Kathryne Hitch, MD;  Location: [facility] OR;  Service: Orthopedics;  Laterality: Left;  . Cardiac catheterization      No prescriptions prior to admission   Allergies  Allergen Reactions  . Tramadol Nausea Only  . Penicillins Rash    History  Substance Use Topics  . Smoking status: Never Smoker   . Smokeless tobacco: Never Used  . Alcohol Use: No    Family History  Problem Relation Age of Onset  . Colon cancer Neg Hx   . Breast cancer Neg Hx   .  Throat cancer Neg Hx   . Stomach cancer Neg Hx      Review of Systems  Musculoskeletal: Positive for joint pain.  All other systems reviewed and are negative.   Objective:  Physical Exam  Constitutional: She is oriented to person, place, and time. She appears well-developed and well-nourished.  HENT:  Head: Normocephalic and atraumatic.  Eyes: EOM are normal. Pupils are equal, round, and reactive to light.  Neck: Normal range of motion. Neck supple.   Cardiovascular: Normal rate and regular rhythm.   Respiratory: Effort normal and breath sounds normal.  GI: Soft. Bowel sounds are normal.  Musculoskeletal:       Right hip: She exhibits decreased range of motion, decreased strength, tenderness and bony tenderness.  Neurological: She is alert and oriented to person, place, and time.  Skin: Skin is warm and dry.  Psychiatric: She has a normal mood and affect.    Vital signs in last 24 hours:    Labs:   Estimated body mass index is 26.94 kg/(m^2) as calculated from the following:   Height as of 01/06/14:  (1.626 m).   Weight as of 01/09/14: 71.215 kg (157 lb).   Imaging Review Plain radiographs demonstrate severe degenerative joint disease of the right hip(s). The bone quality appears to be good for age and reported activity level.  Assessment/Plan:  End stage arthritis, right hip(s)  The patient history, physical examination, clinical judgement of the provider and imaging studies are consistent with end stage degenerative joint disease of the right hip(s) and total hip arthroplasty is deemed medically necessary. The treatment options including medical management, injection therapy, arthroscopy and arthroplasty were discussed at length. The risks and benefits of total hip arthroplasty were presented and reviewed. The risks due to aseptic loosening, infection, stiffness, dislocation/subluxation,  thromboembolic complications and other imponderables were discussed.  The patient acknowledged the explanation, agreed to proceed with the plan and consent was signed. Patient is being admitted for inpatient treatment for surgery, pain control, PT, OT, prophylactic antibiotics, VTE prophylaxis, progressive ambulation and ADL's and discharge planning.The patient is planning to be discharged home with home health services

## 2014-08-19 LAB — BASIC METABOLIC PANEL
Anion gap: 5 (ref 5–15)
BUN: 13 mg/dL (ref 6–20)
CO2: 26 mmol/L (ref 22–32)
Calcium: 8.5 mg/dL — ABNORMAL LOW (ref 8.9–10.3)
Chloride: 106 mmol/L (ref 101–111)
Creatinine, Ser: 0.81 mg/dL (ref 0.44–1.00)
GFR calc Af Amer: >60 mL/min (ref >60)
GFR calc non Af Amer: >60 mL/min (ref >60)
Glucose, Bld: 127 mg/dL — ABNORMAL HIGH (ref 65–99)
Potassium: 4 mmol/L (ref 3.5–5.1)
Sodium: 137 mmol/L (ref 135–145)

## 2014-08-19 LAB — CBC
HCT: 32.1 % — ABNORMAL LOW (ref 36.0–46.0)
Hemoglobin: 10.5 g/dL — ABNORMAL LOW (ref 12.0–15.0)
MCH: 32 pg (ref 26.0–34.0)
MCHC: 32.7 g/dL (ref 30.0–36.0)
MCV: 97.9 fL (ref 78.0–100.0)
Platelets: 182 10*3/uL (ref 150–400)
RBC: 3.28 MIL/uL — ABNORMAL LOW (ref 3.87–5.11)
RDW: 14.2 % (ref 11.5–15.5)
WBC: 7.4 10*3/uL (ref 4.0–10.5)

## 2014-08-19 MED ORDER — ASPIRIN 325 MG PO TBEC: 325.0000 mg | DELAYED_RELEASE_TABLET | Freq: Two times a day (BID) | ORAL | Status: DC

## 2014-08-19 MED ORDER — OXYCODONE-ACETAMINOPHEN 5-325 MG PO TABS: 1.0000 | ORAL_TABLET | ORAL | Status: DC | PRN

## 2014-08-19 NOTE — Clinical Social Work Note (Signed)
Clinical Social Work Assessment  Patient Details  Name: [name] MRN: [id number] Date of Birth: 04-Oct-1928  Date of referral:  08/19/14               Reason for consult:  Facility Placement                Permission sought to share information with:  Facility Sport and exercise psychologist, Family Supports Permission granted to share information::  Yes, Verbal Permission Granted  Name::        Agency::     Relationship::     Contact Information:     Housing/Transportation Living arrangements for the past 2 months:  Single Family Home Source of Information:  Adult Children Patient Interpreter Needed:  None Criminal Activity/Legal Involvement Pertinent to Current Situation/Hospitalization:    Significant Relationships:    Lives with:  Self Do you feel safe going back to the place where you live?    Need for family participation in patient care:  Yes (Comment)  Care giving concerns:  None reported   Facilities manager / plan:  CSW met with pt and her daughter at bedside.  CSW provided explanation of role and encouraged pt and her daughter to discuss history and need for services.  CSW explained SNF protocol and prompted pt and daughter to discuss thoughts and feelings around rehab.  CSW provided active listening.  CSW will send pt information to [facility] where pt is pre registered.  Employment status:  Retired Forensic scientist:  Managed Care PT Recommendations:  [place] / Referral to community resources:     Patient/Family's Response to care:  Pt and daughter pleasant and cooperative.  Pt has 2 sons and 1 daughter.  Pt's son lives across the street from her and all children are supportive and involved.  Pt has rehab history at [facility] last October and has pre registered at [facility] for this rehab stay.    Patient/Family's Understanding of and Emotional Response to Diagnosis, Current Treatment, and Prognosis:  Pt and her daughter  appears to  understand her diagnoses  Emotional Assessment Appearance:  Appears stated age Attitude/Demeanor/Rapport:   (pleasant) Affect (typically observed):  Accepting Orientation:  Oriented to Self, Oriented to Place, Oriented to  Time, Oriented to Situation Alcohol / Substance use:    Psych involvement (Current and /or in the community):  No (Comment)  Discharge Needs  Concerns to be addressed:    Readmission within the last 30 days:    Current discharge risk:    Barriers to Discharge:  No Barriers Identified   Carlean Jews, LCSW 08/19/2014, 4:41 PM

## 2014-08-19 NOTE — Progress Notes (Signed)
OT Cancellation Note  Patient Details Name: Almedia Uplinger MRN: [id number] DOB: 1928/09/21   Cancelled Treatment:    Reason Eval/Treat Not Completed: Other (comment) Defer OT eval to SNF.  Lennox Laity  [phone] 08/19/2014, 1:03 PM

## 2014-08-19 NOTE — Op Note (Signed)
[name], [name] NO.:  [id number]  MEDICAL RECORD NO.:  [id number]  LOCATION:  1601                         FACILITY:  [facility]  PHYSICIAN:  Vanita Panda. Magnus Ivan, M.D.DATE OF BIRTH:  1928/06/14  DATE OF PROCEDURE:  08/18/2014 DATE OF DISCHARGE:                              OPERATIVE REPORT   PREOPERATIVE DIAGNOSIS:  Primer osteoarthritis and degenerative joint disease, right hip.  POSTOPERATIVE DIAGNOSIS:  Primer osteoarthritis and degenerative joint disease, right hip.  PROCEDURE:  Right total hip arthroplasty through direct anterior approach.  IMPLANTS:  DePuy Sector Gription acetabular component size 48 with a single screw, size 32+ 4 neutral polyethylene liner, size 11 Corail femoral component with varus offset (KLA, size 32+ 1 ceramic hip ball).  SURGEON:  Vanita Panda. Magnus Ivan, MD  ASSISTANT:  Richardean Canal, PA  ANESTHESIA:  Spinal.  ANTIBIOTICS:  900 mg of IV clindamycin.  BLOOD LOSS:  250 mL.  COMPLICATIONS:  None.  INDICATIONS:  [name] is an 81 year old female patient of mine who is well known to me.  She has debilitating bilateral hip osteoarthritis and underwent a successful left total hip arthroplasty through direct anterior approach last year.  Her right hip pain has become severe to affect her mobility, her quality of life, and her activities of daily living.  At this point, she has had several injections in that hip that it has got where they do not help anymore but they did help for a while. Now, she does wish to proceed with a total hip arthroplasty on this right side.  She understands the risk of acute blood loss anemia, nerve and vessel injury, fracture, infection, dislocation, and DVT.  She understands the goals are decreased pain, improved mobility, and overall improved quality of life.  PROCEDURE DESCRIPTION:  After informed consent was obtained, appropriate right hip was marked.  She was brought to the operating  room and spinal anesthesia was obtained while she was on her stretcher.  She was then laid in a supine position.  A Foley catheter was placed and both feet had traction boots applied to them.  Next, she was placed supine on the Hana fracture table with a perineal post in place and both legs in inline skeletal traction devices but no traction applied.  Her right operative hip was prepped and draped with DuraPrep and sterile drapes. Time-out was called and she was identified as correct patient, correct right hip.  We then made an incision inferior and posterior to the anterosuperior iliac spine and carried this obliquely down the leg.  I dissected down to the tensor fascia lata muscle.  The tensor fascia was then divided longitudinally so we could proceed with a direct approach to the hip.  We identified and cauterized the lateral femoral circumflex vessels and then placed Cobra retractors around the medial and lateral femoral neck.  With then opened up the hip capsule finding significant degenerative disease of the hip.  We placed Cobra retractors within the capsule.  We then made our femoral neck cut with an oscillating saw proximal to the lesser trochanter and completed this with an osteotome. I placed a corkscrew guide in the femoral head and removed the femoral  head in its entirety.  We then cleaned the acetabular remnants from the acetabular labrum and placed a bent Hohmann over the medial acetabular rim.  We then began reaming under direct visualization from a size 42 reamer up to a size 48 with the last reamer also under direct visualization and direct fluoroscopy so we could obtain our depth of reamer, inclination and anteversion.  Once we were happy with this, we placed the real DePuy Sector Gription acetabular component size 48, the apex hole eliminator, and a single screw.  We then placed the real 32+ 4 neutral polyethylene liner for that size acetabular component. Attention was  then turned to the femur.  With the leg externally rotated to 100 degrees extended and adducted, we placed a Mueller retractor medially and a Hohmann retractor behind the greater trochanter.  I released the lateral joint capsule and used a box cutting osteotome to enter the femoral canal and a rongeur to lateralize.  I then began broaching from a size 8 broach up to a size 11 using Corail broaching system.  Based on preoperative films also, we trialed a varus offset femoral neck and a 32+ 1 trial hip ball and reduced this in the acetabulum.  We were pleased with her leg lengths, offset, range of motion, as well as stability.  We then dislocated the hip and removed the trial components.  We placed the real Corail femoral component with varus offset size 11 and the real 32+ 1 ceramic hip ball, reduced this back in the acetabulum and again we were pleased.  We copiously irrigated the soft tissues with normal saline solution and using pulsatile lavage,  we closed the joint capsule with interrupted #1 Ethibond suture, followed by a running #1 Vicryl in the tensor fascia, 0 Vicryl in the deep tissue, 2-0 Vicryl in the subcutaneous tissue, staples on the skin, Xeroform and an Aquacel dressing.  She was then taken off the Hana table and taken to the recovery room in stable condition.  All final counts were correct.  There were no complications noted.  Of note, Rexene Edison, PA-C assisted in the entire case and his assistance was crucial for facilitating all aspects of this case.     Vanita Panda. Magnus Ivan, M.D.     CYB/MEDQ  D:  08/18/2014  T:  08/19/2014  Job:  [id number]

## 2014-08-19 NOTE — Progress Notes (Signed)
Physical Therapy Treatment Patient Details Name: [name] MRN: [id number] DOB: [birth year] Today's Date: August 21, 2014    History of Present Illness R THR; s/p L THR 10/15    PT Comments    Pt very motivated but progressing slowly 2* nausea  Follow Up Recommendations  SNF     Equipment Recommendations  None recommended by PT    Recommendations for Other Services OT consult     Precautions / Restrictions Precautions Precautions: Fall Restrictions Weight Bearing Restrictions: No Other Position/Activity Restrictions: WBAT    Mobility  Bed Mobility Overal bed mobility: Needs Assistance Bed Mobility: Supine to Sit;Sit to Supine     Supine to sit: Min assist Sit to supine: Min assist   General bed mobility comments: cues for sequence and use of L LE to self assist  Transfers Overall transfer level: Needs assistance Equipment used: Rolling walker (2 wheeled) Transfers: Sit to/from Stand Sit to Stand: Min assist;Mod assist         General transfer comment: cues for LE management and use of UEs to self assist  Ambulation/Gait Ambulation/Gait assistance: Min assist Ambulation Distance (Feet): 74 Feet Assistive device: Rolling walker (2 wheeled) Gait Pattern/deviations: Step-to pattern;Decreased step length - right;Decreased step length - left;Shuffle;Trunk flexed Gait velocity: decreased with multiple rests 2* fatigue and increasing nausea   General Gait Details: cues for posture, position from RW and initial sequence   Stairs            Wheelchair Mobility    Modified Rankin (Stroke Patients Only)       Balance                                    Cognition Arousal/Alertness: Awake/alert Behavior During Therapy: WFL for tasks assessed/performed Overall Cognitive Status: Within Functional Limits for tasks assessed                      Exercises      General Comments        Pertinent Vitals/Pain Pain Assessment:  0-10 Pain Score: 4  Pain Location: R hip Pain Descriptors / Indicators: Aching;Sore Pain Intervention(s): Limited activity within patient's tolerance;Monitored during session;Premedicated before session;Ice applied    Home Living                      Prior Function            PT Goals (current goals can now be found in the care plan section) Acute Rehab PT Goals Patient Stated Goal: Rehab and then home to resume previous lifestyle with decreased pain PT Goal Formulation: With patient Time For Goal Achievement: 08/25/14 Potential to Achieve Goals: Good Progress towards PT goals: Progressing toward goals    Frequency  7X/week    PT Plan Current plan remains appropriate    Co-evaluation             End of Session Equipment Utilized During Treatment: Gait belt Activity Tolerance: Patient tolerated treatment well Patient left: with call bell/phone within reach;in bed     Time: 1410-1435 PT Time Calculation (min) (ACUTE ONLY): 25 min  Charges:  $Gait Training: 23-37 mins                    G Codes:      [name] August 21, 2014, 3:24 PM

## 2014-08-19 NOTE — Progress Notes (Signed)
Subjective: 1 Day Post-Op Procedure(s) (LRB): RIGHT TOTAL HIP ARTHROPLASTY ANTERIOR APPROACH (Right) Patient reports pain as moderate.  Working well with therapy.  Asymptomatic acute blood loss anemia.  Objective: Vital signs in last 24 hours: Temp:  [97.6 F (36.4 C)-98.8 F (37.1 C)] 98.7 F (37.1 C) (06/11 1006) Pulse Rate:  [61-104] 91 (06/11 1006) Resp:  [14-18] 16 (06/11 1006) BP: (94-144)/(44-84) 130/67 mmHg (06/11 1006) SpO2:  [98 %-100 %] 100 % (06/11 1006) Weight:  [73.029 kg (161 lb)] 73.029 kg (161 lb) (06/10 1230)  Intake/Output from previous day: 06/10 0701 - 06/11 0700 In: 4918 [P.O.:1440; I.V.:3215; IV Piggyback:263] Out: 1300 [Urine:1050; Blood:250] Intake/Output this shift: Total I/O In: 240 [P.O.:240] Out: -    Recent Labs  08/19/14 0502  HGB 10.5*    Recent Labs  08/19/14 0502  WBC 7.4  RBC 3.28*  HCT 32.1*  PLT 182    Recent Labs  08/19/14 0502  NA 137  K 4.0  CL 106  CO2 26  BUN 13  CREATININE 0.81  GLUCOSE 127*  CALCIUM 8.5*   No results for input(s): LABPT, INR in the last 72 hours.  Sensation intact distally Intact pulses distally Dorsiflexion/Plantar flexion intact Incision: scant drainage  Assessment/Plan: 1 Day Post-Op Procedure(s) (LRB): RIGHT TOTAL HIP ARTHROPLASTY ANTERIOR APPROACH (Right) Up with therapy Discharge to [facility] Monday  Solyana Nonaka Y 08/19/2014, 11:43 AM

## 2014-08-19 NOTE — Progress Notes (Signed)
Physical Therapy Treatment Patient Details Name: [name] MRN: [id number] DOB: 03-02-1929 Today's Date: 08/19/2014    History of Present Illness R THR; s/p L THR 10/15    PT Comments    Pt very motivated and cooperative but ltd by onset nausea with attempts to ambulate.  Follow Up Recommendations  SNF     Equipment Recommendations  None recommended by PT    Recommendations for Other Services OT consult     Precautions / Restrictions Precautions Precautions: Fall Restrictions Weight Bearing Restrictions: No Other Position/Activity Restrictions: WBAT    Mobility  Bed Mobility Overal bed mobility: Needs Assistance Bed Mobility: Sit to Supine       Sit to supine: Min assist   General bed mobility comments: cues for sequence and use of L LE to self assist  Transfers Overall transfer level: Needs assistance Equipment used: Rolling walker (2 wheeled) Transfers: Sit to/from Stand Sit to Stand: Min assist;Mod assist         General transfer comment: cues for LE management and use of UEs to self assist  Ambulation/Gait Ambulation/Gait assistance: Min assist Ambulation Distance (Feet): 5 Feet Assistive device: Rolling walker (2 wheeled) Gait Pattern/deviations: Step-to pattern;Decreased step length - right;Decreased step length - left;Shuffle;Trunk flexed Gait velocity: decr   General Gait Details: cues for posture, position from RW and initial sequence   Stairs            Wheelchair Mobility    Modified Rankin (Stroke Patients Only)       Balance                                    Cognition Arousal/Alertness: Awake/alert Behavior During Therapy: WFL for tasks assessed/performed Overall Cognitive Status: Within Functional Limits for tasks assessed                      Exercises Total Joint Exercises Ankle Circles/Pumps: AROM;Both;15 reps;Supine Quad Sets: AROM;Both;10 reps;Supine Gluteal Sets: AROM;Both;10  reps;Supine Heel Slides: AAROM;Right;20 reps;Supine Hip ABduction/ADduction: AAROM;Right;15 reps;Supine    General Comments        Pertinent Vitals/Pain Pain Assessment: 0-10 Pain Score: 5  Pain Location: R hip Pain Descriptors / Indicators: Aching;Sore Pain Intervention(s): Limited activity within patient's tolerance;Monitored during session;Premedicated before session;Ice applied    Home Living                      Prior Function            PT Goals (current goals can now be found in the care plan section) Acute Rehab PT Goals Patient Stated Goal: Rehab and then home to resume previous lifestyle with decreased pain PT Goal Formulation: With patient Time For Goal Achievement: 08/25/14 Potential to Achieve Goals: Good Progress towards PT goals: Progressing toward goals    Frequency  7X/week    PT Plan Current plan remains appropriate    Co-evaluation             End of Session Equipment Utilized During Treatment: Gait belt Activity Tolerance: Other (comment) (nausea with mobility) Patient left: with call bell/phone within reach;in bed     Time: 4098-1191 PT Time Calculation (min) (ACUTE ONLY): 31 min  Charges:  $Therapeutic Exercise: 8-22 mins $Therapeutic Activity: 8-22 mins                    G Codes:  Tivis Wherry 08/19/2014, 12:47 PM

## 2014-08-19 NOTE — Discharge Instructions (Signed)

## 2014-08-20 LAB — CBC
HCT: 32.9 % — ABNORMAL LOW (ref 36.0–46.0)
Hemoglobin: 10.8 g/dL — ABNORMAL LOW (ref 12.0–15.0)
MCH: 31.9 pg (ref 26.0–34.0)
MCHC: 32.8 g/dL (ref 30.0–36.0)
MCV: 97.1 fL (ref 78.0–100.0)
Platelets: 189 10*3/uL (ref 150–400)
RBC: 3.39 MIL/uL — ABNORMAL LOW (ref 3.87–5.11)
RDW: 14.2 % (ref 11.5–15.5)
WBC: 11.6 10*3/uL — ABNORMAL HIGH (ref 4.0–10.5)

## 2014-08-20 NOTE — Progress Notes (Signed)
Physical Therapy Treatment Note    08/20/14 1500  PT Visit Information  Last PT Received On 08/20/14  Assistance Needed +1  History of Present Illness R THR; s/p L THR 10/15  PT Time Calculation  PT Start Time (ACUTE ONLY) 1430  PT Stop Time (ACUTE ONLY) 1443  PT Time Calculation (min) (ACUTE ONLY) 13 min  Subjective Data  Subjective Pt ambulated again in hallway and then assisted back to bed.  Pt progressing very well.  Precautions  Precautions Fall  Restrictions  Other Position/Activity Restrictions WBAT  Pain Assessment  Pain Assessment 0-10  Pain Score 2  Pain Location R hip  Pain Descriptors / Indicators Aching;Sore  Pain Intervention(s) Limited activity within patient's tolerance;Monitored during session;Repositioned;Ice applied  Cognition  Arousal/Alertness Awake/alert  Behavior During Therapy WFL for tasks assessed/performed  Overall Cognitive Status Within Functional Limits for tasks assessed  Bed Mobility  Overal bed mobility Needs Assistance  Bed Mobility Sit to Supine  Sit to supine Min guard  General bed mobility comments verbal cues for self assist  Transfers  Overall transfer level Needs assistance  Equipment used Rolling walker (2 wheeled)  Transfers Sit to/from Stand  Sit to Stand Min guard  General transfer comment verbal cues for LE positioning  Ambulation/Gait  Ambulation/Gait assistance Min guard  Ambulation Distance (Feet) 160 Feet  Assistive device Rolling walker (2 wheeled)  General Gait Details verbal cues for less stiff gait, increasing R LE hip/knee flexion  Gait Pattern/deviations Step-through pattern;Antalgic  PT - End of Session  Activity Tolerance Patient tolerated treatment well  Patient left in bed;with call bell/phone within reach  PT - Assessment/Plan  PT Plan Current plan remains appropriate  PT Frequency (ACUTE ONLY) 7X/week  Follow Up Recommendations SNF  PT equipment None recommended by PT  PT Goal Progression  Progress  towards PT goals Progressing toward goals  PT General Charges  $$ ACUTE PT VISIT 1 Procedure  PT Treatments  $Gait Training 8-22 mins   Zenovia Jarred, PT, DPT 08/20/2014 Pager: [phone]

## 2014-08-20 NOTE — Plan of Care (Signed)
Problem: Phase III Progression Outcomes Goal: Anticoagulant follow-up in place Outcome: Not Applicable Date Met:  08/20/14 asa     

## 2014-08-20 NOTE — Progress Notes (Signed)
Subjective: Patient stable she is having a better day today than yesterday she is working with the physical therapist at this time   Objective: Vital signs in last 24 hours: Temp:  [98.2 F (36.8 C)-100.6 F (38.1 C)] 98.2 F (36.8 C) (06/12 0558) Pulse Rate:  [64-91] 76 (06/12 0558) Resp:  [14-16] 16 (06/12 0558) BP: (130-144)/(59-87) 144/60 mmHg (06/12 0558) SpO2:  [95 %-100 %] 95 % (06/12 0558)  Intake/Output from previous day: 06/11 0701 - 06/12 0700 In: 780 [P.O.:780] Out: -  Intake/Output this shift:    Exam:  Dorsiflexion/Plantar flexion intact  Labs:  Recent Labs  08/19/14 0502 08/20/14 0500  HGB 10.5* 10.8*    Recent Labs  08/19/14 0502 08/20/14 0500  WBC 7.4 11.6*  RBC 3.28* 3.39*  HCT 32.1* 32.9*  PLT 182 189    Recent Labs  08/19/14 0502  NA 137  K 4.0  CL 106  CO2 26  BUN 13  CREATININE 0.81  GLUCOSE 127*  CALCIUM 8.5*   No results for input(s): LABPT, INR in the last 72 hours.  Assessment/Plan: Patient is doing well plan discharge to [facility] place tomorrow   DEAN,GREGORY SCOTT 08/20/2014, 9:22 AM

## 2014-08-20 NOTE — Progress Notes (Signed)
Physical Therapy Treatment Patient Details Name: [name] MRN: [id number] DOB: [birth year] Today's Date: 08/20/2014    History of Present Illness R THR; s/p L THR 10/15    PT Comments    Pt progressing well with mobility.  Follow Up Recommendations  SNF     Equipment Recommendations  None recommended by PT    Recommendations for Other Services OT consult     Precautions / Restrictions Precautions Precautions: Fall Restrictions Weight Bearing Restrictions: No Other Position/Activity Restrictions: WBAT    Mobility  Bed Mobility Overal bed mobility: Needs Assistance Bed Mobility: Supine to Sit     Supine to sit: Min assist     General bed mobility comments: cues for sequence and use of L LE to self assist  Transfers Overall transfer level: Needs assistance Equipment used: Rolling walker (2 wheeled) Transfers: Sit to/from Stand Sit to Stand: Min assist         General transfer comment: cues for LE management and use of UEs to self assist  Ambulation/Gait Ambulation/Gait assistance: Min assist;Min guard Ambulation Distance (Feet): 100 Feet (twice) Assistive device: Rolling walker (2 wheeled) Gait Pattern/deviations: Step-to pattern;Step-through pattern;Decreased step length - right;Decreased step length - left;Shuffle;Trunk flexed     General Gait Details: min cues for posture and position from RW.  Pt progressed to recip gait   Stairs            Wheelchair Mobility    Modified Rankin (Stroke Patients Only)       Balance                                    Cognition Arousal/Alertness: Awake/alert Behavior During Therapy: WFL for tasks assessed/performed Overall Cognitive Status: Within Functional Limits for tasks assessed                      Exercises Total Joint Exercises Ankle Circles/Pumps: AROM;Both;15 reps;Supine Quad Sets: AROM;Both;10 reps;Supine Gluteal Sets: AROM;Both;10 reps;Supine Heel Slides:  AAROM;Right;20 reps;Supine Hip ABduction/ADduction: AAROM;Right;Supine;20 reps    General Comments        Pertinent Vitals/Pain Pain Assessment: 0-10 Pain Score: 3  Pain Location: R hip/thigh Pain Descriptors / Indicators: Aching;Tightness Pain Intervention(s): Limited activity within patient's tolerance;Monitored during session;Premedicated before session;Ice applied    Home Living                      Prior Function            PT Goals (current goals can now be found in the care plan section) Acute Rehab PT Goals Patient Stated Goal: Rehab and then home to resume previous lifestyle with decreased pain PT Goal Formulation: With patient Time For Goal Achievement: 08/25/14 Potential to Achieve Goals: Good Progress towards PT goals: Progressing toward goals    Frequency  7X/week    PT Plan Current plan remains appropriate    Co-evaluation             End of Session Equipment Utilized During Treatment: Gait belt Activity Tolerance: Patient tolerated treatment well Patient left: with call bell/phone within reach;in chair     Time: 4098-1191 PT Time Calculation (min) (ACUTE ONLY): 33 min  Charges:  $Gait Training: 8-22 mins $Therapeutic Exercise: 8-22 mins                    G Codes:      [name]  08/20/2014, 9:46 AM

## 2014-08-20 NOTE — Care Management Note (Signed)
Case Management Note  Patient Details  Name: [name] MRN: [id number] Date of Birth: [birth year]  Subjective/Objective:       RIGHT TOTAL HIP ARTHROPLASTY             Action/Plan: SNF at discharge  Expected Discharge Date:       08/20/2014           Expected Discharge Plan:  Skilled Nursing Facility  In-House Referral:  Clinical Social Work  Discharge planning Services  CM Consult   Status of Service:  Completed, signed off  Medicare Important Message Given:    Date Medicare IM Given:    Medicare IM give by:    Date Additional Medicare IM Given:    Additional Medicare Important Message give by:     If discussed at Long Length of Stay Meetings, dates discussed:    Additional Comments: NCM spoke to pt and states plan dc to SNF.  Elliot Cousin, RN 08/20/2014, 2:17 PM

## 2014-08-21 LAB — CBC
HCT: 28.2 % — ABNORMAL LOW (ref 36.0–46.0)
Hemoglobin: 9.3 g/dL — ABNORMAL LOW (ref 12.0–15.0)
MCH: 31.8 pg (ref 26.0–34.0)
MCHC: 33 g/dL (ref 30.0–36.0)
MCV: 96.6 fL (ref 78.0–100.0)
Platelets: 173 10*3/uL (ref 150–400)
RBC: 2.92 MIL/uL — ABNORMAL LOW (ref 3.87–5.11)
RDW: 14.2 % (ref 11.5–15.5)
WBC: 8.8 10*3/uL (ref 4.0–10.5)

## 2014-08-21 NOTE — Progress Notes (Signed)
Called and gave report to [place] at [facility]. Pt vitals were stable at discharge with incision site WNL. Pt's daughter transported pt to [facility] with discharge packet. Patient had no questions or concerns.

## 2014-08-21 NOTE — Progress Notes (Signed)
Physical Therapy Treatment Patient Details Name: [name] MRN: [id number] DOB: 11/27/1928 Today's Date: 09-09-2014    History of Present Illness R THR; s/p L THR 10/15    PT Comments    Pt progressing well.  Pt to d/c to SNF today.  Follow Up Recommendations  SNF     Equipment Recommendations  None recommended by PT    Recommendations for Other Services       Precautions / Restrictions Precautions Precautions: Fall Restrictions Other Position/Activity Restrictions: WBAT    Mobility  Bed Mobility Overal bed mobility: Needs Assistance Bed Mobility: Supine to Sit     Supine to sit: Supervision     General bed mobility comments: verbal cues for self assist  Transfers Overall transfer level: Needs assistance Equipment used: Rolling walker (2 wheeled) Transfers: Sit to/from Stand Sit to Stand: Supervision         General transfer comment: verbal cues for LE positioning  Ambulation/Gait Ambulation/Gait assistance: Supervision;Min guard Ambulation Distance (Feet): 160 Feet Assistive device: Rolling walker (2 wheeled) Gait Pattern/deviations: Step-through pattern;Antalgic     General Gait Details: verbal cues for less stiff gait, increasing R LE hip/knee flexion   Stairs            Wheelchair Mobility    Modified Rankin (Stroke Patients Only)       Balance                                    Cognition Arousal/Alertness: Awake/alert Behavior During Therapy: WFL for tasks assessed/performed Overall Cognitive Status: Within Functional Limits for tasks assessed                      Exercises Total Joint Exercises Long Arc Quad: AROM;Right;10 reps;Seated Marching in Standing: Seated;AROM;Right;10 reps    General Comments        Pertinent Vitals/Pain Pain Assessment: 0-10 Pain Score: 2  Pain Location: R hip Pain Descriptors / Indicators: Sore Pain Intervention(s): Limited activity within patient's  tolerance;Monitored during session;Repositioned    Home Living                      Prior Function            PT Goals (current goals can now be found in the care plan section) Progress towards PT goals: Progressing toward goals    Frequency  7X/week    PT Plan Current plan remains appropriate    Co-evaluation             End of Session   Activity Tolerance: Patient tolerated treatment well Patient left: in chair;with call bell/phone within reach     Time: 0951-1003 PT Time Calculation (min) (ACUTE ONLY): 12 min  Charges:  $Gait Training: 8-22 mins                    G Codes:      Jada Kuhnert,KATHrine E 09-09-2014, 2:04 PM Zenovia Jarred, PT, DPT 2014-09-09 Pager: [phone]

## 2014-08-21 NOTE — Care Management Note (Signed)
Case Management Note  Patient Details  Name: [name] MRN: [id number] Date of Birth: 05-03-1928  Expected Discharge Date:                  Expected Discharge Plan:  Skilled Nursing Facility  In-House Referral:  Clinical Social Work  Discharge planning Services  CM Consult  Post Acute Care Choice:    Choice offered to:     DME Arranged:    DME Agency:     HH Arranged:    HH Agency:     Status of Service:  Completed, signed off  Medicare Important Message Given:  Yes Date Medicare IM Given:  08/21/14 Medicare IM give by:  Lorenda Ishihara RN CM  Date Additional Medicare IM Given:    Additional Medicare Important Message give by:     If discussed at Long Length of Stay Meetings, dates discussed:    Additional Comments:  Alexis Goodell, RN 08/21/2014, 10:12 AM

## 2014-08-21 NOTE — Clinical Social Work Placement (Signed)
Patient is set to discharge to [facility] today. Patient & daughter, Diane aware. Discharge packet given to RN, Darl Pikes. Daughter to transport to SNF.    Lincoln Maxin, LCSW [facility] Clinical Social Worker cell #: [phone]    CLINICAL SOCIAL WORK PLACEMENT  NOTE  Date:  08/21/2014  Patient Details  Name: [name] MRN: [id number] Date of Birth: 20-Nov-1928  Clinical Social Work is seeking post-discharge placement for this patient at the   level of care (*CSW will initial, date and re-position this form in  chart as items are completed):  Yes   Patient/family provided with [place] Clinical Social Work Department's list of facilities offering this level of care within the geographic area requested by the patient (or if unable, by the patient's family).  Yes   Patient/family informed of their freedom to choose among providers that offer the needed level of care, that participate in Medicare, Medicaid or managed care program needed by the patient, have an available bed and are willing to accept the patient.  Yes   Patient/family informed of 's ownership interest in [facility] and [facility], as well as of the fact that they are under no obligation to receive care at these facilities.  PASRR submitted to EDS on       PASRR number received on       Existing PASRR number confirmed on 08/19/14     FL2 transmitted to all facilities in geographic area requested by pt/family on 08/19/14     FL2 transmitted to all facilities within larger geographic area on       Patient informed that his/her managed care company has contracts with or will negotiate with certain facilities, including the following:        Yes   Patient/family informed of bed offers received.  Patient chooses bed at [facility])     Physician recommends and patient chooses bed at      Patient to be transferred to [facility] on 08/21/14.  Patient to be  transferred to facility by patient's daughter's car     Patient family notified on 08/21/14 of transfer.  Name of family member notified:  patient's daughter     PHYSICIAN       Additional Comment:    _______________________________________________ Arlyss Repress, LCSW 08/21/2014, 8:59 AM

## 2014-08-21 NOTE — Discharge Summary (Signed)
Patient ID: [name] MRN: [id number] DOB/AGE: [birth year] 81 y.o.  Admit date: 08/18/2014 Discharge date: 08/21/2014  Admission Diagnoses:  Principal Problem:   Osteoarthritis of right hip Active Problems:   Status post total replacement of right hip   Discharge Diagnoses:  Same  Past Medical History  Diagnosis Date  . Hypertension   . Pneumonia   . Arthritis     left hip  . Colon polyps     type not known, year(s) of occurrence not defined.   . Acute duodenal ulcer with hemorrhage 01/18/2013  . Acute gastric ulcers with hemorrhage 01/18/2013  . Aortic valve replaced   . Nocturia   . Anemia   . History of transfusion   . Heart murmur   . Asthma     hx of in childhood   . GERD (gastroesophageal reflux disease)     Surgeries: Procedure(s): RIGHT TOTAL HIP ARTHROPLASTY ANTERIOR APPROACH on 08/18/2014   Consultants:    Discharged Condition: Improved  Hospital Course: [name] is an 81 y.o. female who was admitted 08/18/2014 for operative treatment ofOsteoarthritis of right hip. Patient has severe unremitting pain that affects sleep, daily activities, and work/hobbies. After pre-op clearance the patient was taken to the operating room on 08/18/2014 and underwent  Procedure(s): RIGHT TOTAL HIP ARTHROPLASTY ANTERIOR APPROACH.    Patient was given perioperative antibiotics: Anti-infectives    Start     Dose/Rate Route Frequency Ordered Stop   08/18/14 1600  clindamycin (CLEOCIN) IVPB 600 mg     600 mg 100 mL/hr over 30 Minutes Intravenous Every 6 hours 08/18/14 1232 08/18/14 2222   08/18/14 0746  clindamycin (CLEOCIN) IVPB 900 mg     900 mg 100 mL/hr over 30 Minutes Intravenous On call to O.R. 08/18/14 0746 08/18/14 1022       Patient was given sequential compression devices, early ambulation, and chemoprophylaxis to prevent DVT.  Patient benefited maximally from hospital stay and there were no complications.    Recent vital signs: Patient Vitals for the  past 24 hrs:  BP Temp Temp src Pulse Resp SpO2  08/20/14 2200 122/70 mmHg 98.4 F (36.9 C) Oral 81 16 97 %  08/20/14 1352 112/66 mmHg 98.3 F (36.8 C) Oral 77 16 96 %     Recent laboratory studies:  Recent Labs  08/19/14 0502 08/20/14 0500 08/21/14 0510  WBC 7.4 11.6* 8.8  HGB 10.5* 10.8* 9.3*  HCT 32.1* 32.9* 28.2*  PLT 182 189 173  NA 137  --   --   K 4.0  --   --   CL 106  --   --   CO2 26  --   --   BUN 13  --   --   CREATININE 0.81  --   --   GLUCOSE 127*  --   --   CALCIUM 8.5*  --   --      Discharge Medications:     Medication List    TAKE these medications        acetaminophen 500 MG tablet  Commonly known as:  TYLENOL  Take 500 mg by mouth every 6 (six) hours as needed for fever.     ascorbic acid 500 MG tablet  Commonly known as:  VITAMIN C  Take 1 tablet (500 mg total) by mouth 2 (two) times daily.     aspirin 325 MG EC tablet  Take 1 tablet (325 mg total) by mouth 2 (two) times daily after a meal.  ferrous sulfate 325 (65 FE) MG tablet  Take 1 tablet (325 mg total) by mouth 3 (three) times daily after meals.     ICAPS PO  Take 1 capsule by mouth every morning.     Menthol (Topical Analgesic) 154 MG Pads  Apply 1 each topically once as needed (pain in knee.).     metoprolol tartrate 25 MG tablet  Commonly known as:  LOPRESSOR  Take 25 mg by mouth every morning.     oxyCODONE-acetaminophen 5-325 MG per tablet  Commonly known as:  ROXICET  Take 1-2 tablets by mouth every 4 (four) hours as needed.     pantoprazole 40 MG tablet  Commonly known as:  PROTONIX  Take 40 mg by mouth every morning.     REFRESH OP  Apply 1-2 drops to eye daily as needed (dry eyes.).     saccharomyces boulardii 250 MG capsule  Commonly known as:  FLORASTOR  Take 250 mg by mouth every morning.     Vitamin D3 2000 UNITS capsule  Take 2,000 Units by mouth every morning.     zinc sulfate 220 MG capsule  Take 1 capsule (220 mg total) by mouth daily.         Diagnostic Studies: Dg C-arm 1-60 Min-no Report  08/18/2014   CLINICAL DATA: surgery   C-ARM 1-60 MINUTES  Fluoroscopy was utilized by the requesting physician.  No radiographic  interpretation.    Dg Hip Port Unilat With Pelvis 1v Right  08/18/2014   CLINICAL DATA:  Postop total right hip replacement.  EXAM: RIGHT HIP (WITH PELVIS) 1 VIEW PORTABLE  COMPARISON:  12/23/2013  FINDINGS: A total right hip arthroplasty is well seated. No periprosthetic fracture. No dislocation. Unremarkable appearance of a pre-existing total left hip arthroplasty. Expected postoperative gas.  IMPRESSION: Unremarkable bilateral total hip arthroplasty, new on the right.   Electronically Signed   By: Marnee Spring M.D.   On: 08/18/2014 12:09   Dg Hip Operative Unilat With Pelvis Right  08/18/2014   CLINICAL DATA:  Right total hip replacement.  EXAM: OPERATIVE RIGHT HIP WITH PELVIS; DG C-ARM 1-60 MIN - NRPT [facility]  COMPARISON:  None.  FLUOROSCOPY TIME:  Radiation Exposure Index (as provided by the fluoroscopic device):  If the device does not provide the exposure index:  Fluoroscopy Time:  3 seconds  Number of Acquired Images:  0  FINDINGS: Two intraoperative spot images demonstrate changes of right hip replacement. Normal alignment. No hardware or bony complicating features. Remote changes of left hip replacement.  IMPRESSION: New right hip replacement. No hardware or bony complicating feature.   Electronically Signed   By: Charlett Nose M.D.   On: 08/18/2014 11:15    Disposition: Skilled nursing facility         Follow-up Information    Follow up with Kathryne Hitch, MD In 2 weeks.   Specialty:  Orthopedic Surgery   Contact information:   [address] [place] [address] [phone]        Signed: Richardean Canal 08/21/2014, 7:51 AM

## 2014-08-21 NOTE — Progress Notes (Signed)
Subjective: 3 Days Post-Op Procedure(s) (LRB): RIGHT TOTAL HIP ARTHROPLASTY ANTERIOR APPROACH (Right) Patient reports pain as moderate.  No complaints. Denies chest pain , SOB or dizziness.Tolerating diet well. Objective: Vital signs in last 24 hours: Temp:  [98.3 F (36.8 C)-98.4 F (36.9 C)] 98.4 F (36.9 C) (06/12 2200) Pulse Rate:  [77-81] 81 (06/12 2200) Resp:  [16] 16 (06/12 2200) BP: (112-122)/(66-70) 122/70 mmHg (06/12 2200) SpO2:  [96 %-97 %] 97 % (06/12 2200)  Intake/Output from previous day: 06/12 0701 - 06/13 0700 In: 1320 [P.O.:1320] Out: 650 [Urine:650] Intake/Output this shift:     Recent Labs  08/19/14 0502 08/20/14 0500 08/21/14 0510  HGB 10.5* 10.8* 9.3*    Recent Labs  08/20/14 0500 08/21/14 0510  WBC 11.6* 8.8  RBC 3.39* 2.92*  HCT 32.9* 28.2*  PLT 189 173    Recent Labs  08/19/14 0502  NA 137  K 4.0  CL 106  CO2 26  BUN 13  CREATININE 0.81  GLUCOSE 127*  CALCIUM 8.5*   No results for input(s): LABPT, INR in the last 72 hours. Right lower Extremity: Sensation intact distally Intact pulses distally Dorsiflexion/Plantar flexion intact Incision: moderate drainage Compartment soft  Assessment/Plan: 3 Days Post-Op Procedure(s) (LRB): RIGHT TOTAL HIP ARTHROPLASTY ANTERIOR APPROACH (Right) Discharge to SNF  Dressing changed right hip Acute blood loss anemia secondary to surgery without symptoms Richardean Canal 08/21/2014, 7:48 AM

## 2014-08-24 ENCOUNTER — Encounter: Payer: Self-pay | Admitting: Adult Health

## 2014-08-24 ENCOUNTER — Non-Acute Institutional Stay (SKILLED_NURSING_FACILITY): Payer: Medicare Other | Admitting: Adult Health

## 2014-08-24 DIAGNOSIS — I10 Essential (primary) hypertension: Secondary | ICD-10-CM

## 2014-08-24 DIAGNOSIS — M1611 Unilateral primary osteoarthritis, right hip: Secondary | ICD-10-CM | POA: Diagnosis not present

## 2014-08-24 DIAGNOSIS — K219 Gastro-esophageal reflux disease without esophagitis: Secondary | ICD-10-CM

## 2014-08-24 DIAGNOSIS — D62 Acute posthemorrhagic anemia: Secondary | ICD-10-CM

## 2014-08-24 NOTE — Progress Notes (Signed)
Patient ID: [name], female   DOB: 1928/11/29, 81 y.o.   MRN: [id number]   08/24/2014  Facility:  Nursing Home Location:  [facility] Room Number: 703-P LEVEL OF CARE:  SNF (31)   Chief Complaint  Patient presents with  . Hospitalization Follow-up    Osteoarthritis S/P right total hip arthroplasty, anemia, hypertension and GERD    HISTORY OF PRESENT ILLNESS:  This is an 81 year old female who was been admitted to [facility] on 08/21/14 from [facility] with osteoarthritis S/P right total hip arthroplasty. She has PMH of hypertension, anemia, GERD, aortic valve replacement and asthma.  She has been admitted for a short-term rehabilitation.  PAST MEDICAL HISTORY:  Past Medical History  Diagnosis Date  . Hypertension   . Pneumonia   . Arthritis     left hip  . Colon polyps     type not known, year(s) of occurrence not defined.   . Acute duodenal ulcer with hemorrhage 01/18/2013  . Acute gastric ulcers with hemorrhage 01/18/2013  . Aortic valve replaced   . Nocturia   . Anemia   . History of transfusion   . Heart murmur   . Asthma     hx of in childhood   . GERD (gastroesophageal reflux disease)     CURRENT MEDICATIONS: Reviewed per MAR/see medication list  Allergies  Allergen Reactions  . Tramadol Nausea Only  . Penicillins Rash     REVIEW OF SYSTEMS:  GENERAL: no change in appetite, no fatigue, no weight changes, no fever, chills or weakness RESPIRATORY: no cough, SOB, DOE, wheezing, hemoptysis CARDIAC: no chest pain, edema or palpitations GI: no abdominal pain, diarrhea, constipation, heart burn, nausea or vomiting  PHYSICAL EXAMINATION  GENERAL: no acute distress, normal body habitus SKIN:  Right hip surgical incision is covered with Aquacel, no erythema EYES: conjunctivae normal, sclerae normal, normal eye lids NECK: supple, trachea midline, no neck masses, no thyroid tenderness, no thyromegaly LYMPHATICS: no  LAN in the neck, no supraclavicular LAN RESPIRATORY: breathing is even & unlabored, BS CTAB CARDIAC: RRR, no murmur,no extra heart sounds, no edema GI: abdomen soft, normal BS, no masses, no tenderness, no hepatomegaly, no splenomegaly EXTREMITIES:  Able to move 4 extremities PSYCHIATRIC: the patient is alert & oriented to person, affect & behavior appropriate  LABS/RADIOLOGY: Labs reviewed: Basic Metabolic Panel:  Recent Labs  40/98/11 0701 08/15/14 1100 08/19/14 0502  NA 138 138 137  K 5.1 4.4 4.0  CL 101 103 106  CO2 24 24 26   GLUCOSE 121* 86 127*  BUN 14 23* 13  CREATININE 0.88 1.06* 0.81  CALCIUM 9.3 9.5 8.5*   CBC:  Recent Labs  08/19/14 0502 08/20/14 0500 08/21/14 0510  WBC 7.4 11.6* 8.8  HGB 10.5* 10.8* 9.3*  HCT 32.1* 32.9* 28.2*  MCV 97.9 97.1 96.6  PLT 182 189 173     Dg C-arm 1-60 Min-no Report  08/18/2014   CLINICAL DATA: surgery   C-ARM 1-60 MINUTES  Fluoroscopy was utilized by the requesting physician.  No radiographic  interpretation.    Dg Hip Port Unilat With Pelvis 1v Right  08/18/2014   CLINICAL DATA:  Postop total right hip replacement.  EXAM: RIGHT HIP (WITH PELVIS) 1 VIEW PORTABLE  COMPARISON:  12/23/2013  FINDINGS: A total right hip arthroplasty is well seated. No periprosthetic fracture. No dislocation. Unremarkable appearance of a pre-existing total left hip arthroplasty. Expected postoperative gas.  IMPRESSION: Unremarkable bilateral total hip  arthroplasty, new on the right.   Electronically Signed   By: Marnee Spring M.D.   On: 08/18/2014 12:09   Dg Hip Operative Unilat With Pelvis Right  08/18/2014   CLINICAL DATA:  Right total hip replacement.  EXAM: OPERATIVE RIGHT HIP WITH PELVIS; DG C-ARM 1-60 MIN - NRPT [facility]  COMPARISON:  None.  FLUOROSCOPY TIME:  Radiation Exposure Index (as provided by the fluoroscopic device):  If the device does not provide the exposure index:  Fluoroscopy Time:  3 seconds  Number of Acquired Images:  0   FINDINGS: Two intraoperative spot images demonstrate changes of right hip replacement. Normal alignment. No hardware or bony complicating features. Remote changes of left hip replacement.  IMPRESSION: New right hip replacement. No hardware or bony complicating feature.   Electronically Signed   By: Charlett Nose M.D.   On: 08/18/2014 11:15    ASSESSMENT/PLAN:  Osteoarthritis S/P right total hip arthroplasty - for rehabilitation; follow-up with Dr. Doneen Poisson, orthopedic surgeon, in 2 weeks; continue aspirin 325 mg 1 tab by mouth twice a day for DVT prophylaxis; and Percocet 5/325 mg 1-2 tabs by mouth every 4 hours when necessary for pain Anemia, acute blood loss - hemoglobin 9.3; continue ferrous sulfate 325 mg 1 tab by mouth 3 times a day Hypertension - well controlled; continue metoprolol titrate 25 mg 1 tab by mouth every morning GERD - continue Protonix 40 mg 1 tab by mouth every morning   Goals of care:  Short-term rehabilitation   Labs/test ordered:  CBC and CMP    MEDINA-VARGAS,Zanyiah Posten, NP [facility] [phone]

## 2014-08-25 ENCOUNTER — Non-Acute Institutional Stay (SKILLED_NURSING_FACILITY): Payer: Medicare Other | Admitting: Internal Medicine

## 2014-08-25 ENCOUNTER — Encounter: Payer: Self-pay | Admitting: Internal Medicine

## 2014-08-25 DIAGNOSIS — I10 Essential (primary) hypertension: Secondary | ICD-10-CM | POA: Diagnosis not present

## 2014-08-25 DIAGNOSIS — H04123 Dry eye syndrome of bilateral lacrimal glands: Secondary | ICD-10-CM

## 2014-08-25 DIAGNOSIS — M1611 Unilateral primary osteoarthritis, right hip: Secondary | ICD-10-CM

## 2014-08-25 DIAGNOSIS — E559 Vitamin D deficiency, unspecified: Secondary | ICD-10-CM | POA: Diagnosis not present

## 2014-08-25 DIAGNOSIS — D62 Acute posthemorrhagic anemia: Secondary | ICD-10-CM

## 2014-08-25 DIAGNOSIS — K269 Duodenal ulcer, unspecified as acute or chronic, without hemorrhage or perforation: Secondary | ICD-10-CM | POA: Diagnosis not present

## 2014-08-25 NOTE — Progress Notes (Signed)
Patient ID: [name], female   DOB: March 17, 1928, 81 y.o.   MRN: [id number]     [facility]  PCP: Oletha Blend, MD  Code Status: Full Code   Allergies  Allergen Reactions  . Tramadol Nausea Only  . Penicillins Rash    Chief Complaint  Patient presents with  . Medical Management of Chronic Issues    New Admission      HPI:  81 year old patient is here for short term rehabilitation post hospital admission from 08/18/14-08/21/14 with right hip OA. She underwent right total hip arthroplasty. She is seen in her room today. Her pain is under control with current pain regimen. She has been working with therapy team. She denies any concerns. She mentions that her dressing was soaked with drainage yesterday and her daughter who is a nurse came and got the necessary supplies and changed it for her. She has pmh of HTN, AVR, arthritis and duodenal ulcer.  Review of Systems:  Constitutional: Negative for fever, chills, diaphoresis.  HENT: Negative for headache, congestion, nasal discharge Eyes: Negative for eye pain, blurred vision, double vision and discharge.  Respiratory: Negative for cough, shortness of breath and wheezing.   Cardiovascular: Negative for chest pain, palpitations, leg swelling.  Gastrointestinal: Negative for heartburn, nausea, vomiting, abdominal pain. Bowel movement yesterday. Appetite is fair Genitourinary: Negative for dysuria Musculoskeletal: Negative for back pain, falls Skin: Negative for itching, rash.  Neurological: Negative for dizziness, tingling, focal weakness Psychiatric/Behavioral: Negative for depression   Past Medical History  Diagnosis Date  . Hypertension   . Pneumonia   . Arthritis     left hip  . Colon polyps     type not known, year(s) of occurrence not defined.   . Acute duodenal ulcer with hemorrhage 01/18/2013  . Acute gastric ulcers with hemorrhage 01/18/2013  . Aortic valve replaced   . Nocturia   . Anemia   .  History of transfusion   . Heart murmur   . Asthma     hx of in childhood   . GERD (gastroesophageal reflux disease)    Past Surgical History  Procedure Laterality Date  . Tissue aortic valve replacement  Nov 18 2012    at [facility]  . Dilation and curettage of uterus      x 2.  . Cataract extraction Bilateral   . Esophagogastroduodenoscopy N/A 01/18/2013    Procedure: ESOPHAGOGASTRODUODENOSCOPY (EGD);  Surgeon: Iva Boop, MD;  Location: [facility] ENDOSCOPY;  Service: Endoscopy;  Laterality: N/A;  . Total hip arthroplasty Left 12/23/2013    Procedure: TOTAL HIP ARTHROPLASTY ANTERIOR APPROACH, LEFT;  Surgeon: Kathryne Hitch, MD;  Location: [facility] ORS;  Service: Orthopedics;  Laterality: Left;  . Incision and drainage hip Left 01/09/2014    Procedure: IRRIGATION AND DEBRIDEMENT LEFT HIP;  Surgeon: Kathryne Hitch, MD;  Location: [facility] OR;  Service: Orthopedics;  Laterality: Left;  . Cardiac catheterization    . Total hip arthroplasty Right 08/18/2014    Procedure: RIGHT TOTAL HIP ARTHROPLASTY ANTERIOR APPROACH;  Surgeon: Kathryne Hitch, MD;  Location: [facility] ORS;  Service: Orthopedics;  Laterality: Right;   Social History:   reports that she has never smoked. She has never used smokeless tobacco. She reports that she does not drink alcohol or use illicit drugs.  Family History  Problem Relation Age of Onset  . Colon cancer Neg Hx   . Breast cancer Neg Hx   . Throat cancer Neg Hx   .  Stomach cancer Neg Hx     Medications: Patient's Medications  New Prescriptions   No medications on file  Previous Medications   ACETAMINOPHEN (TYLENOL) 500 MG TABLET    Take 500 mg by mouth every 6 (six) hours as needed for fever.    ALUMINUM-MAGNESIUM HYDROXIDE-SIMETHICONE (MAALOX) 200-200-20 MG/5ML SUSP    Take 30 mLs by mouth every 4 (four) hours. X 3 days as of 08/22/14   ASPIRIN EC 325 MG EC TABLET    Take 1 tablet (325 mg total) by mouth 2 (two) times daily after a meal.    CHOLECALCIFEROL (VITAMIN D3) 2000 UNITS CAPSULE    Take 2,000 Units by mouth every morning.   FERROUS SULFATE 325 (65 FE) MG TABLET    Take 1 tablet (325 mg total) by mouth 3 (three) times daily after meals.   MENTHOL, TOPICAL ANALGESIC, 154 MG PADS    Apply 1 each topically once as needed (pain in knee.).   METOPROLOL TARTRATE (LOPRESSOR) 25 MG TABLET    Take 25 mg by mouth every morning.   MULTIPLE VITAMINS-MINERALS (ICAPS PO)    Take 1 capsule by mouth every morning.   OXYCODONE-ACETAMINOPHEN (ROXICET) 5-325 MG PER TABLET    Take 1-2 tablets by mouth every 4 (four) hours as needed.   PANTOPRAZOLE (PROTONIX) 40 MG TABLET    Take 40 mg by mouth every morning.   POLYVINYL ALCOHOL-POVIDONE (REFRESH OP)    Apply 1-2 drops to eye daily as needed (dry eyes.).   SACCHAROMYCES BOULARDII (FLORASTOR) 250 MG CAPSULE    Take 250 mg by mouth every morning.   VITAMIN C (VITAMIN C) 500 MG TABLET    Take 1 tablet (500 mg total) by mouth 2 (two) times daily.   ZINC SULFATE 220 MG CAPSULE    Take 1 capsule (220 mg total) by mouth daily.  Modified Medications   No medications on file  Discontinued Medications   No medications on file     Physical Exam: Filed Vitals:   08/25/14 0901  BP: 144/86  Pulse: 98  Temp: 97.8 F (36.6 C)  TempSrc: Oral  Resp: 18  Height: 5\' 4"  (1.626 m)  Weight: 165 lb 6.4 oz (75.025 kg)  SpO2: 97%    General- elderly female, well built, in no acute distress Head- normocephalic, atraumatic Throat- moist mucus membrane Eyes- EOMI, no pallor, no icterus, no discharge, normal conjunctiva, normal sclera Neck- no cervical lymphadenopathy Cardiovascular- normal s1,s2, no murmurs, palpable dorsalis pedis, trace right leg edema Respiratory- bilateral clear to auscultation, no wheeze, no rhonchi, no crackles, no use of accessory muscles Abdomen- bowel sounds present, soft, non tender Musculoskeletal- able to move all 4 extremities, right leg ROM limited  Neurological- no focal  deficit Skin- warm and dry, right hip aquacel dressing appears clean Psychiatry- alert and oriented to person, place and time, normal mood and affect    Labs reviewed: Basic Metabolic Panel:  Recent Labs  57/47/34 0701 08/15/14 1100 08/19/14 0502  NA 138 138 137  K 5.1 4.4 4.0  CL 101 103 106  CO2 24 24 26   GLUCOSE 121* 86 127*  BUN 14 23* 13  CREATININE 0.88 1.06* 0.81  CALCIUM 9.3 9.5 8.5*   Liver Function Tests: No results for input(s): AST, ALT, ALKPHOS, BILITOT, PROT, ALBUMIN in the last 8760 hours. No results for input(s): LIPASE, AMYLASE in the last 8760 hours. No results for input(s): AMMONIA in the last 8760 hours. CBC:  Recent Labs  08/19/14 0502 08/20/14  0500 08/21/14 0510  WBC 7.4 11.6* 8.8  HGB 10.5* 10.8* 9.3*  HCT 32.1* 32.9* 28.2*  MCV 97.9 97.1 96.6  PLT 182 189 173    Assessment/Plan  Right hip Osteoarthritis  S/P right total hip arthroplasty. Continue aspirin 325 mg bid for dvt prophylaxis. Continue percocet 5-325 1-2 tab q4h prn pain. Will have her work with physical therapy and occupational therapy team to help with gait training and muscle strengthening exercises.fall precautions. Skin care. Encourage to be out of bed. Has follow up with dr Magnus Ivan.  Blood loss anemia Post op, monitor h&h. Continue ferrous sulfate 325 mg tid for now with vitamin c  Hypertension Stable, continue metoprolol 25 mg daily and monitor bp reading  Vitamin d def Continue vitamin d 2000 u daily  Duodenal ulcer Stable, continue Protonix 40 mg daily  Dry eyes Continue refresh eye drops   Goals of care: short term rehabilitation   Labs/tests ordered: cbc   Family/ staff Communication: reviewed care plan with patient and nursing supervisor    Oneal Grout, MD  [facility] [phone] (Monday-Friday 8 am - 5 pm) [phone] (afterhours)

## 2014-09-01 ENCOUNTER — Non-Acute Institutional Stay (SKILLED_NURSING_FACILITY): Payer: Medicare Other | Admitting: Adult Health

## 2014-09-01 ENCOUNTER — Encounter: Payer: Self-pay | Admitting: Adult Health

## 2014-09-01 DIAGNOSIS — K219 Gastro-esophageal reflux disease without esophagitis: Secondary | ICD-10-CM | POA: Diagnosis not present

## 2014-09-01 DIAGNOSIS — I10 Essential (primary) hypertension: Secondary | ICD-10-CM

## 2014-09-01 DIAGNOSIS — D62 Acute posthemorrhagic anemia: Secondary | ICD-10-CM | POA: Diagnosis not present

## 2014-09-01 DIAGNOSIS — M1611 Unilateral primary osteoarthritis, right hip: Secondary | ICD-10-CM

## 2014-09-05 NOTE — Progress Notes (Signed)
Patient ID: [name], female   DOB: 07/23/1928, 81 y.o.   MRN: [id number]   09/01/14  Facility:  Nursing Home Location:  [facility] Room Number: 705-P LEVEL OF CARE:  SNF (31)   Chief Complaint  Patient presents with  . Discharge Note    Osteoarthritis S/P right total hip arthroplasty, anemia, hypertension and GERD    HISTORY OF PRESENT ILLNESS:  This is an 81 year old female who is for discharge home with Home health PT for endurance and OT for ADLs. She has been admitted to [facility] on 08/21/14 from [facility] with osteoarthritis S/P right total hip arthroplasty. She has PMH of hypertension, anemia, GERD, aortic valve replacement and asthma.  Patient was admitted to this facility for short-term rehabilitation after the patient's recent hospitalization.  Patient has completed SNF rehabilitation and therapy has cleared the patient for discharge.   PAST MEDICAL HISTORY:  Past Medical History  Diagnosis Date  . Hypertension   . Pneumonia   . Arthritis     left hip  . Colon polyps     type not known, year(s) of occurrence not defined.   . Acute duodenal ulcer with hemorrhage 01/18/2013  . Acute gastric ulcers with hemorrhage 01/18/2013  . Aortic valve replaced   . Nocturia   . Anemia   . History of transfusion   . Heart murmur   . Asthma     hx of in childhood   . GERD (gastroesophageal reflux disease)     CURRENT MEDICATIONS: Reviewed per MAR/see medication list  Allergies  Allergen Reactions  . Tramadol Nausea Only  . Penicillins Rash     REVIEW OF SYSTEMS:  GENERAL: no change in appetite, no fatigue, no weight changes, no fever, chills or weakness RESPIRATORY: no cough, SOB, DOE, wheezing, hemoptysis CARDIAC: no chest pain, edema or palpitations GI: no abdominal pain, diarrhea, constipation, heart burn, nausea or vomiting  PHYSICAL EXAMINATION  GENERAL: no acute distress, normal body habitus SKIN:  Right hip  surgical incision is dry, no erythema NECK: supple, trachea midline, no neck masses, no thyroid tenderness, no thyromegaly LYMPHATICS: no LAN in the neck, no supraclavicular LAN RESPIRATORY: breathing is even & unlabored, BS CTAB CARDIAC: RRR, no murmur,no extra heart sounds, no edema GI: abdomen soft, normal BS, no masses, no tenderness, no hepatomegaly, no splenomegaly EXTREMITIES:  Able to move 4 extremities PSYCHIATRIC: the patient is alert & oriented to person, affect & behavior appropriate  LABS/RADIOLOGY: Labs reviewed: Basic Metabolic Panel:  Recent Labs  40/98/11 0701 08/15/14 1100 08/19/14 0502  NA 138 138 137  K 5.1 4.4 4.0  CL 101 103 106  CO2 GLUCOSE 121* 86 127*  BUN 14 23* 13  CREATININE 0.88 1.06* 0.81  CALCIUM 9.3 9.5 8.5*   CBC:  Recent Labs  08/19/14 0502 08/20/14 0500 08/21/14 0510  WBC 7.4 11.6* 8.8  HGB 10.5* 10.8* 9.3*  HCT 32.1* 32.9* 28.2*  MCV 97.9 97.1 96.6  PLT 182 189 173     Dg C-arm 1-60 Min-no Report  08/18/2014   CLINICAL DATA: surgery   C-ARM 1-60 MINUTES  Fluoroscopy was utilized by the requesting physician.  No radiographic  interpretation.    Dg Hip Port Unilat With Pelvis 1v Right  08/18/2014   CLINICAL DATA:  Postop total right hip replacement.  EXAM: RIGHT HIP (WITH PELVIS) 1 VIEW PORTABLE  COMPARISON:  12/23/2013  FINDINGS: A total right hip arthroplasty  is well seated. No periprosthetic fracture. No dislocation. Unremarkable appearance of a pre-existing total left hip arthroplasty. Expected postoperative gas.  IMPRESSION: Unremarkable bilateral total hip arthroplasty, new on the right.   Electronically Signed   By: Marnee SpringJonathon  Watts M.D.   On: 08/18/2014 12:09   Dg Hip Operative Unilat With Pelvis Right  08/18/2014   CLINICAL DATA:  Right total hip replacement.  EXAM: OPERATIVE RIGHT HIP WITH PELVIS; DG C-ARM 1-60 MIN - NRPT [facility]  COMPARISON:  None.  FLUOROSCOPY TIME:  Radiation Exposure Index (as provided by the  fluoroscopic device):  If the device does not provide the exposure index:  Fluoroscopy Time:  3 seconds  Number of Acquired Images:  0  FINDINGS: Two intraoperative spot images demonstrate changes of right hip replacement. Normal alignment. No hardware or bony complicating features. Remote changes of left hip replacement.  IMPRESSION: New right hip replacement. No hardware or bony complicating feature.   Electronically Signed   By: Charlett NoseKevin  Dover M.D.   On: 08/18/2014 11:15    ASSESSMENT/PLAN:  Osteoarthritis S/P right total hip arthroplasty - for rehabilitation; follow-up with Dr. Doneen Poissonhristopher Blackman, orthopedic surgeon; continue aspirin 81 mg 1 tab by mouthdaily for DVT prophylaxis; and Percocet 5/325 mg 1-2 tabs by mouth every 4 hours when necessary for pain Anemia, acute blood loss - hemoglobin 9.3; continue ferrous sulfate 325 mg 1 tab by mouth 3 times a day Hypertension - well controlled; continue metoprolol titrate 25 mg 1 tab by mouth every morning GERD - continue Protonix 40 mg 1 tab by mouth every morning     I have filled out patient's discharge paperwork and written prescriptions.  Patient will receive home health PT and OT.   Total discharge time: Less than 30 minutes  Discharge time involved coordination of the discharge process with Child psychotherapistsocial worker, nursing staff and therapy department. Medical justification for home health services verified.    [facility] Behavioral CenterMEDINA-VARGAS,MONINA, NP [facility] [phone]

## 2015-02-07 ENCOUNTER — Emergency Department (HOSPITAL_COMMUNITY): Payer: Worker's Compensation

## 2015-02-07 ENCOUNTER — Encounter (HOSPITAL_COMMUNITY): Payer: Self-pay

## 2015-02-07 ENCOUNTER — Encounter: Payer: Self-pay | Admitting: Emergency Medicine

## 2015-02-07 ENCOUNTER — Emergency Department (HOSPITAL_COMMUNITY)
Admission: EM | Admit: 2015-02-07 | Discharge: 2015-02-07 | Disposition: A | Discharge status: Home / Self Care | Payer: Worker's Compensation | Attending: Emergency Medicine | Admitting: Emergency Medicine

## 2015-02-07 ENCOUNTER — Ambulatory Visit (INDEPENDENT_AMBULATORY_CARE_PROVIDER_SITE_OTHER): Payer: Worker's Compensation | Admitting: Emergency Medicine

## 2015-02-07 ENCOUNTER — Ambulatory Visit: Payer: Worker's Compensation

## 2015-02-07 VITALS — BP 204/92 | HR 90 | Temp 97.7°F | Resp 16

## 2015-02-07 DIAGNOSIS — D649 Anemia, unspecified: Secondary | ICD-10-CM | POA: Insufficient documentation

## 2015-02-07 DIAGNOSIS — Y9389 Activity, other specified: Secondary | ICD-10-CM | POA: Diagnosis not present

## 2015-02-07 DIAGNOSIS — I10 Essential (primary) hypertension: Secondary | ICD-10-CM | POA: Insufficient documentation

## 2015-02-07 DIAGNOSIS — Z79899 Other long term (current) drug therapy: Secondary | ICD-10-CM | POA: Insufficient documentation

## 2015-02-07 DIAGNOSIS — Z8701 Personal history of pneumonia (recurrent): Secondary | ICD-10-CM | POA: Insufficient documentation

## 2015-02-07 DIAGNOSIS — M1990 Unspecified osteoarthritis, unspecified site: Secondary | ICD-10-CM | POA: Insufficient documentation

## 2015-02-07 DIAGNOSIS — Z23 Encounter for immunization: Secondary | ICD-10-CM | POA: Diagnosis not present

## 2015-02-07 DIAGNOSIS — Z7982 Long term (current) use of aspirin: Secondary | ICD-10-CM | POA: Insufficient documentation

## 2015-02-07 DIAGNOSIS — S025XXB Fracture of tooth (traumatic), initial encounter for open fracture: Secondary | ICD-10-CM

## 2015-02-07 DIAGNOSIS — S022XXA Fracture of nasal bones, initial encounter for closed fracture: Secondary | ICD-10-CM

## 2015-02-07 DIAGNOSIS — Z880 Allergy status to penicillin: Secondary | ICD-10-CM | POA: Diagnosis not present

## 2015-02-07 DIAGNOSIS — S0180XA Unspecified open wound of other part of head, initial encounter: Secondary | ICD-10-CM | POA: Diagnosis not present

## 2015-02-07 DIAGNOSIS — S01511A Laceration without foreign body of lip, initial encounter: Secondary | ICD-10-CM

## 2015-02-07 DIAGNOSIS — J45909 Unspecified asthma, uncomplicated: Secondary | ICD-10-CM | POA: Diagnosis not present

## 2015-02-07 DIAGNOSIS — R030 Elevated blood-pressure reading, without diagnosis of hypertension: Secondary | ICD-10-CM

## 2015-02-07 DIAGNOSIS — Y9289 Other specified places as the place of occurrence of the external cause: Secondary | ICD-10-CM | POA: Diagnosis not present

## 2015-02-07 DIAGNOSIS — S0993XA Unspecified injury of face, initial encounter: Secondary | ICD-10-CM | POA: Diagnosis present

## 2015-02-07 DIAGNOSIS — Z9889 Other specified postprocedural states: Secondary | ICD-10-CM | POA: Diagnosis not present

## 2015-02-07 DIAGNOSIS — Y998 Other external cause status: Secondary | ICD-10-CM | POA: Insufficient documentation

## 2015-02-07 DIAGNOSIS — IMO0001 Reserved for inherently not codable concepts without codable children: Secondary | ICD-10-CM

## 2015-02-07 DIAGNOSIS — K219 Gastro-esophageal reflux disease without esophagitis: Secondary | ICD-10-CM | POA: Insufficient documentation

## 2015-02-07 DIAGNOSIS — Z86010 Personal history of colonic polyps: Secondary | ICD-10-CM | POA: Diagnosis not present

## 2015-02-07 DIAGNOSIS — Z952 Presence of prosthetic heart valve: Secondary | ICD-10-CM | POA: Insufficient documentation

## 2015-02-07 DIAGNOSIS — R011 Cardiac murmur, unspecified: Secondary | ICD-10-CM | POA: Insufficient documentation

## 2015-02-07 DIAGNOSIS — W010XXA Fall on same level from slipping, tripping and stumbling without subsequent striking against object, initial encounter: Secondary | ICD-10-CM | POA: Diagnosis not present

## 2015-02-07 DIAGNOSIS — K000 Anodontia: Secondary | ICD-10-CM

## 2015-02-07 MED ORDER — CLINDAMYCIN HCL 300 MG PO CAPS: 300.0000 mg | ORAL_CAPSULE | Freq: Three times a day (TID) | ORAL | Status: AC

## 2015-02-07 MED ORDER — CLINDAMYCIN HCL 150 MG PO CAPS
300.0000 mg | ORAL_CAPSULE | Freq: Once | ORAL | Status: AC
  Administered 2015-02-07: 300 mg via ORAL
  Filled 2015-02-07: qty 2

## 2015-02-07 MED ORDER — OXYCODONE-ACETAMINOPHEN 5-325 MG PO TABS: 1.0000 | ORAL_TABLET | ORAL | Status: AC | PRN

## 2015-02-07 MED ORDER — OXYCODONE-ACETAMINOPHEN 5-325 MG PO TABS
1.0000 | ORAL_TABLET | Freq: Once | ORAL | Status: AC
  Administered 2015-02-07: 1 via ORAL
  Filled 2015-02-07: qty 1

## 2015-02-07 MED ORDER — OXYMETAZOLINE HCL 0.05 % NA SOLN
1.0000 | Freq: Once | NASAL | Status: AC
  Administered 2015-02-07: 1 via NASAL
  Filled 2015-02-07: qty 15

## 2015-02-07 NOTE — ED Provider Notes (Signed)
CSN: [id number]     Arrival date & time 02/07/15  1012 History   First MD Initiated Contact with Patient 02/07/15 1028     Chief Complaint  Patient presents with  . Fall     (Consider location/radiation/quality/duration/timing/severity/associated sxs/prior Treatment) HPI   81 year old female presenting after fall. Patient lost her balance when she tripped over an uneven concrete surface. She fell face first and struck her face. No loss of consciousness. She was seen in urgent care prior to arrival and then refer to the emergency room. Denies much pain aside from her face/mouth. Takes a baby aspirin. No nausea or vomiting. No acute visual changes. Baseline mental status per her daughter at bedside.  Past Medical History  Diagnosis Date  . Hypertension   . Pneumonia   . Arthritis     left hip  . Colon polyps     type not known, year(s) of occurrence not defined.   . Acute duodenal ulcer with hemorrhage 01/18/2013  . Acute gastric ulcers with hemorrhage 01/18/2013  . Aortic valve replaced   . Nocturia   . Anemia   . History of transfusion   . Heart murmur   . Asthma     hx of in childhood   . GERD (gastroesophageal reflux disease)    Past Surgical History  Procedure Laterality Date  . Tissue aortic valve replacement  Nov 18 2012    at [facility]  . Dilation and curettage of uterus      x 2.  . Cataract extraction Bilateral   . Esophagogastroduodenoscopy N/A 01/18/2013    Procedure: ESOPHAGOGASTRODUODENOSCOPY (EGD);  Surgeon: Iva Booparl E Gessner, MD;  [facility];  Service: Endoscopy;  Laterality: N/A;  . Total hip arthroplasty Left 12/23/2013    Procedure: TOTAL HIP ARTHROPLASTY ANTERIOR APPROACH, LEFT;  Surgeon: Kathryne Hitchhristopher Y Blackman, MD;  Location: [facility] ORS;  Service: Orthopedics;  Laterality: Left;  . Incision and drainage hip Left 01/09/2014    Procedure: IRRIGATION AND DEBRIDEMENT LEFT HIP;  Surgeon: Kathryne Hitchhristopher Y Blackman, MD;  [facility];  Service:  Orthopedics;  Laterality: Left;  . Cardiac catheterization    . Total hip arthroplasty Right 08/18/2014    Procedure: RIGHT TOTAL HIP ARTHROPLASTY ANTERIOR APPROACH;  Surgeon: Kathryne Hitchhristopher Y Blackman, MD;  Location: [facility] OR[facility];  Service: Orthopedics;  Laterality: Right;   Family History  Problem Relation Age of Onset  . Colon cancer Neg Hx   . Breast cancer Neg Hx   . Throat cancer Neg Hx   . Stomach cancer Neg Hx    Social History  Substance Use Topics  . Smoking status: Never Smoker   . Smokeless tobacco: Never Used  . Alcohol Use: No   OB History    No data available     Review of Systems  All systems reviewed and negative, other than as noted in HPI.   Allergies  Tramadol and Penicillins  Home Medications   Prior to Admission medications   Medication Sig Start Date End Date Taking? Authorizing Provider  alendronate (FOSAMAX) 70 MG tablet Take 70 mg by mouth once a week. Take with a full glass of water on an empty stomach.    Historical Provider, MD  aspirin 81 MG tablet Take 81 mg by mouth daily.    Historical Provider, MD  Cholecalciferol (VITAMIN D3) 2000 UNITS capsule Take 2,000 Units by mouth every morning.    Historical Provider, MD  ferrous sulfate 325 (65 FE) MG tablet Take 1 tablet (325 mg total)  by mouth 3 (three) times daily after meals. Patient taking differently: Take 325 mg by mouth every morning.  12/26/13   Kathryne Hitch, MD  metoprolol tartrate (LOPRESSOR) 25 MG tablet Take 25 mg by mouth every morning.    Historical Provider, MD  Multiple Vitamins-Minerals (ICAPS PO) Take 1 capsule by mouth every morning.    Historical Provider, MD  pantoprazole (PROTONIX) 40 MG tablet Take 40 mg by mouth every morning.    Historical Provider, MD  Polyvinyl Alcohol-Povidone (REFRESH OP) Apply 1-2 drops to eye daily as needed (dry eyes.).    Historical Provider, MD  saccharomyces boulardii (FLORASTOR) 250 MG capsule Take 250 mg by mouth every morning.    Historical  Provider, MD  vitamin C (VITAMIN C) 500 MG tablet Take 1 tablet (500 mg total) by mouth 2 (two) times daily. 01/11/14   Kathryne Hitch, MD  zinc sulfate 220 MG capsule Take 1 capsule (220 mg total) by mouth daily. 01/11/14   Kathryne Hitch, MD   BP 200/86 mmHg  Pulse 80  Temp(Src) 97.9 F (36.6 C) (Oral)  Resp 18  Ht 5\' 4"  (1.626 m)  Wt 172 lb (78.019 kg)  BMI 29.51 kg/m2  SpO2 98% Physical Exam  Constitutional: She appears well-developed and well-nourished. No distress.  HENT:  Head: Normocephalic and atraumatic.  And tenderness and ecchymosis over the bridge of the nose. Some dried blood noted in either nostril but no septal hematoma or active bleeding noted. Laceration to the oral mucosa of the upper lip. No active bleeding. Upper lateral incisor essentially broken at the gumline. Adjacent incisor appears to be in correct anatomic position, but is mildly loose when palpating. No TMJ tenderness or along the length of the mandible. No midline spinal tenderness.  Eyes: Conjunctivae are normal. Right eye exhibits no discharge. Left eye exhibits no discharge.  Neck: Neck supple.  Cardiovascular: Normal rate, regular rhythm and normal heart sounds.  Exam reveals no gallop and no friction rub.   No murmur heard. Pulmonary/Chest: Effort normal and breath sounds normal. No respiratory distress.  Abdominal: Soft. She exhibits no distension. There is no tenderness.  Musculoskeletal: She exhibits no edema or tenderness.  Neurological: She is alert.  Skin: Skin is warm and dry.  Psychiatric: She has a normal mood and affect. Her behavior is normal. Thought content normal.  Nursing note and vitals reviewed.   ED Course  Procedures (including critical care time) Labs Review Labs Reviewed - No data to display  Imaging Review Dg Facial Bones Complete  02/07/2015  CLINICAL DATA:  Fall.  Initial evaluation. EXAM: FACIAL BONES COMPLETE 3+V COMPARISON:  None. FINDINGS: No acute  bony abnormality identified. Orbits are intact. Paranasal sinuses are clear. Mandibles intact. If symptoms persist CT can be obtained. IMPRESSION: No acute abnormality. Electronically Signed   By: Maisie Fus  Register   On: 02/07/2015 09:05   Dg Nasal Bones  02/07/2015  CLINICAL DATA:  81 year old female who fell this morning. Face contusion. Initial encounter. EXAM: NASAL BONES - 3+ VIEW COMPARISON:  Facial bone series from today reported separately. FINDINGS: Mildly displaced bilateral nasal bone fractures with overlying soft tissue swelling. Normal underlying bone mineralization. IMPRESSION: Mildly displaced bilateral nasal bone fractures. Electronically Signed   By: Odessa Fleming M.D.   On: 02/07/2015 09:05   Ct Head Wo Contrast  02/07/2015  CLINICAL DATA:  Tripped and fell striking head on concrete facial abrasions to nose, upper lip and chin, loss of consciousness, history hypertension, asthma  EXAM: CT HEAD WITHOUT CONTRAST CT MAXILLOFACIAL WITHOUT CONTRAST CT CERVICAL SPINE WITHOUT CONTRAST TECHNIQUE: Multidetector CT imaging of the head, cervical spine, and maxillofacial structures were performed using the standard protocol without intravenous contrast. Multiplanar CT image reconstructions of the cervical spine and maxillofacial structures were also generated. COMPARISON:  None FINDINGS: CT HEAD FINDINGS Generalized atrophy. Normal ventricular morphology. No midline shift or mass effect. Otherwise normal appearance of brain parenchyma. No intracranial hemorrhage, mass lesion or evidence acute infarction. No extra-axial fluid collections. Calvaria intact. Partial opacification of inferior LEFT mastoid air cells. CT MAXILLOFACIAL FINDINGS Numerous beam hardening artifacts of dental origin. Soft tissue swelling at upper lip and nose extending to the infraorbital regions. Minimal supraorbital scalp hematoma on RIGHT. Visualized intracranial structures unremarkable. Intraorbital soft tissue planes clear.  Atherosclerotic calcifications at the carotid bifurcations bilaterally. Angular deformity of mid nasal septum likely representing fracture. Nasal septal deviation to the LEFT. Displaced RIGHT nasal bone fracture. Small distal LEFT nasal bone fracture coronal image 18. Orbits and sinuses appear intact. No additional facial bone fractures identified. Small mucosal retention cyst sphenoid sinus. CT CERVICAL SPINE FINDINGS Prevertebral soft tissues normal thickness. Bones appear slightly demineralized. Multilevel disc space narrowing and endplate spur formation throughout cervical spine. Minimal facet degenerative changes cervical spine. Vertebral body heights maintained without fracture or bone destruction. Minimal retrolisthesis C3-C4 and C4-C5 likely degenerative. Visualized skullbase intact. Partial opacification of the inferior LEFT mastoid air cells. Mild biapical scarring. IMPRESSION: No acute intracranial abnormalities. BILATERAL nasal bone fractures RIGHT greater than LEFT. Probable nasal septal fracture. Degenerative disc disease changes cervical spine. No acute cervical spine abnormalities. Electronically Signed   By: Ulyses Southward M.D.   On: 02/07/2015 12:13   Ct Cervical Spine Wo Contrast  02/07/2015  CLINICAL DATA:  Tripped and fell striking head on concrete facial abrasions to nose, upper lip and chin, loss of consciousness, history hypertension, asthma EXAM: CT HEAD WITHOUT CONTRAST CT MAXILLOFACIAL WITHOUT CONTRAST CT CERVICAL SPINE WITHOUT CONTRAST TECHNIQUE: Multidetector CT imaging of the head, cervical spine, and maxillofacial structures were performed using the standard protocol without intravenous contrast. Multiplanar CT image reconstructions of the cervical spine and maxillofacial structures were also generated. COMPARISON:  None FINDINGS: CT HEAD FINDINGS Generalized atrophy. Normal ventricular morphology. No midline shift or mass effect. Otherwise normal appearance of brain parenchyma. No  intracranial hemorrhage, mass lesion or evidence acute infarction. No extra-axial fluid collections. Calvaria intact. Partial opacification of inferior LEFT mastoid air cells. CT MAXILLOFACIAL FINDINGS Numerous beam hardening artifacts of dental origin. Soft tissue swelling at upper lip and nose extending to the infraorbital regions. Minimal supraorbital scalp hematoma on RIGHT. Visualized intracranial structures unremarkable. Intraorbital soft tissue planes clear. Atherosclerotic calcifications at the carotid bifurcations bilaterally. Angular deformity of mid nasal septum likely representing fracture. Nasal septal deviation to the LEFT. Displaced RIGHT nasal bone fracture. Small distal LEFT nasal bone fracture coronal image 18. Orbits and sinuses appear intact. No additional facial bone fractures identified. Small mucosal retention cyst sphenoid sinus. CT CERVICAL SPINE FINDINGS Prevertebral soft tissues normal thickness. Bones appear slightly demineralized. Multilevel disc space narrowing and endplate spur formation throughout cervical spine. Minimal facet degenerative changes cervical spine. Vertebral body heights maintained without fracture or bone destruction. Minimal retrolisthesis C3-C4 and C4-C5 likely degenerative. Visualized skullbase intact. Partial opacification of the inferior LEFT mastoid air cells. Mild biapical scarring. IMPRESSION: No acute intracranial abnormalities. BILATERAL nasal bone fractures RIGHT greater than LEFT. Probable nasal septal fracture. Degenerative disc disease changes cervical spine. No acute cervical  spine abnormalities. Electronically Signed   By: Ulyses Southward M.D.   On: 02/07/2015 12:13   Ct Maxillofacial Wo Cm  02/07/2015  CLINICAL DATA:  Tripped and fell striking head on concrete facial abrasions to nose, upper lip and chin, loss of consciousness, history hypertension, asthma EXAM: CT HEAD WITHOUT CONTRAST CT MAXILLOFACIAL WITHOUT CONTRAST CT CERVICAL SPINE WITHOUT  CONTRAST TECHNIQUE: Multidetector CT imaging of the head, cervical spine, and maxillofacial structures were performed using the standard protocol without intravenous contrast. Multiplanar CT image reconstructions of the cervical spine and maxillofacial structures were also generated. COMPARISON:  None FINDINGS: CT HEAD FINDINGS Generalized atrophy. Normal ventricular morphology. No midline shift or mass effect. Otherwise normal appearance of brain parenchyma. No intracranial hemorrhage, mass lesion or evidence acute infarction. No extra-axial fluid collections. Calvaria intact. Partial opacification of inferior LEFT mastoid air cells. CT MAXILLOFACIAL FINDINGS Numerous beam hardening artifacts of dental origin. Soft tissue swelling at upper lip and nose extending to the infraorbital regions. Minimal supraorbital scalp hematoma on RIGHT. Visualized intracranial structures unremarkable. Intraorbital soft tissue planes clear. Atherosclerotic calcifications at the carotid bifurcations bilaterally. Angular deformity of mid nasal septum likely representing fracture. Nasal septal deviation to the LEFT. Displaced RIGHT nasal bone fracture. Small distal LEFT nasal bone fracture coronal image 18. Orbits and sinuses appear intact. No additional facial bone fractures identified. Small mucosal retention cyst sphenoid sinus. CT CERVICAL SPINE FINDINGS Prevertebral soft tissues normal thickness. Bones appear slightly demineralized. Multilevel disc space narrowing and endplate spur formation throughout cervical spine. Minimal facet degenerative changes cervical spine. Vertebral body heights maintained without fracture or bone destruction. Minimal retrolisthesis C3-C4 and C4-C5 likely degenerative. Visualized skullbase intact. Partial opacification of the inferior LEFT mastoid air cells. Mild biapical scarring. IMPRESSION: No acute intracranial abnormalities. BILATERAL nasal bone fractures RIGHT greater than LEFT. Probable nasal  septal fracture. Degenerative disc disease changes cervical spine. No acute cervical spine abnormalities. Electronically Signed   By: Ulyses Southward M.D.   On: 02/07/2015 12:13   I have personally reviewed and evaluated these images and lab results as part of my medical decision-making.   EKG Interpretation None      MDM   Final diagnoses:  Nasal bone fractures, closed, initial encounter  Fractured tooth, open, initial encounter  Lip laceration, initial encounter    81 year old female presenting after fall. Seen in urgent care approximately 2 arrival. Nasal fracture on plain films. Sent to ED for further evaluation. Nonfocal neuro exam. Upper lateral incisor fractured at gumline. Needs dental/surgical follow-up. Will place on abx, particularly with hx of aortic valve replacement. Small abraded area to the upper lip. It is right at the vermillion border but not  amenable to suturing. Laceration to the oral mucosa of upper lip. No active bleeding. No dental fragments or other foreign body noted in it. Will leave open. Should heal fine by secondary intention. Soft diet and saline rinses. Minimal epistaxis. Will spray with Afrin. No blood thinners aside from aspirin. We'll obtain further imaging of head, cervical spine and facial bones.  Imaging as above. Oral surgical follow-up. They may potentially address the nasal fractures. If not, ENT follow-up. Patient's daughter works in the operating room and sounds familiar with several surgeons. Specific provider follow-up per her discretion.  Raeford Razor, MD 02/16/15 [phone]

## 2015-02-07 NOTE — Discharge Instructions (Signed)
Mouth Laceration A mouth laceration is a deep cut in the lining of your mouth (mucosa). The laceration may extend into your lip or go all of the way through your mouth and cheek. Lacerations inside your mouth may involve your tongue, the insides of your cheeks, or the upper surface of your mouth (palate). Mouth lacerations may bleed a lot because your mouth has a very rich blood supply. Mouth lacerations may need to be repaired with stitches (sutures). CAUSES Any type of facial injury can cause a mouth laceration. Common causes include:  Getting hit in the mouth.  Being in a car accident. SYMPTOMS The most common sign of a mouth laceration is bleeding that fills the mouth. DIAGNOSIS Your health care provider can diagnose a mouth laceration by examining your mouth. Your mouth may need to be washed out (irrigated) with a sterile salt-water (saline) solution. Your health care provider may also have to remove any blood clots to determine how bad your injury is. You may need X-rays of the bones in your jaw or your face to rule out other injuries, such as dental injuries, facial fractures, or jaw fractures. TREATMENT Treatment depends on the location and severity of your injury. Small mouth lacerations may not need treatment if bleeding has stopped. You may need sutures if:  You have a tongue laceration.  Your mouth laceration is large or deep, or it continues to bleed. If sutures are necessary, your health care provider will use absorbable sutures that dissolve as your body heals. You may also receive antibiotic medicine or a tetanus shot. HOME CARE INSTRUCTIONS  Take medicines only as directed by your health care provider.  If you were prescribed an antibiotic medicine, finish all of it even if you start to feel better.  Eat as directed by your health care provider. You may only be able to drink liquids or eat soft foods for a few days.  Rinse your mouth with a warm, salt-water rinse 4-6  times per day or as directed by your health care provider. You can make a salt-water rinse by mixing one tsp of salt into two cups of warm water.  Do not poke the sutures with your tongue. Doing that can loosen them.  Check your wound every day for signs of infection. It is normal to have a white or gray patch over your wound while it heals. Watch for:  Redness.  Swelling.  Blood or pus.  Maintain regular oral hygiene, if possible. Gently brush your teeth with a soft, nylon-bristled toothbrush 2 times per day.  Keep all follow-up visits as directed by your health care provider. This is important. SEEK MEDICAL CARE IF:  You were given a tetanus shot and have swelling, severe pain, redness, or bleeding at the injection site.  You have a fever.  Your pain is not controlled with medicine.  You have redness, swelling, or pain at your wound that is getting worse.  You have fresh bleeding or pus coming from your wound.  The edges of your wound break open.  You develop swollen, tender glands in your throat. SEEK IMMEDIATE MEDICAL CARE IF:   Your face or the area under your jaw becomes swollen.  You have trouble breathing or swallowing.   This information is not intended to replace advice given to you by your health care provider. Make sure you discuss any questions you have with your health care provider.   Document Released: 02/24/2005 Document Revised: 07/11/2014 Document Reviewed: 02/15/2014 Elsevier Interactive Patient  Education 2016 [employer].  Nasal Fracture A nasal fracture is a break or crack in the bones or cartilage of the nose. Minor breaks do not require treatment. These breaks usually heal on their own after about one month. Serious breaks may require surgery. CAUSES This injury is usually caused by a blunt injury to the nose. This type of injury often occurs from:  Contact sports.  Car accidents.  Falls.  Getting punched. SYMPTOMS Symptoms of this  injury include:  Pain.  Swelling of the nose.  Bleeding from the nose.  Bruising around the nose or eyes. This may include having black eyes.  Crooked appearance of the nose. DIAGNOSIS This injury may be diagnosed with a physical exam. The health care provider will gently feel the nose for signs of broken bones. He or she will look inside the nostrils to make sure that there is not a blood-filled swelling on the dividing wall between the nostrils (septal hematoma). X-rays of the nose may not show a nasal fracture even when one is present. In some cases, X-rays or a CT scan may be done 1-5 days after the injury. Sometimes, the health care provider will want to wait until the swelling has gone down. TREATMENT Often, minor fractures that have caused no deformity do not require treatment. More serious fractures in which bones have moved out of position may require surgery, which will take place after the swelling is gone. Surgery will stabilize and align the fracture. In some cases, a health care provider may be able to reposition the bones without surgery. This may be done in the health care provider's office after medicine is given to numb the area (local anesthetic). HOME CARE INSTRUCTIONS  If directed, apply ice to the injured area:  Put ice in a plastic bag.  Place a towel between your skin and the bag.  Leave the ice on for 20 minutes, 2-3 times per day.  Take over-the-counter and prescription medicines only as told by your health care provider.  If your nose starts to bleed, sit in an upright position while you squeeze the soft parts of your nose against the dividing wall between your nostrils (septum) for 10 minutes.  Try to avoid blowing your nose.  Return to your normal activities as told by your health care provider. Ask your health care provider what activities are safe for you.  Avoid contact sports for 3-4 weeks or as told by your health care provider.  Keep all follow-up  visits as told by your health care provider. This is important. SEEK MEDICAL CARE IF:  Your pain increases or becomes severe.  You continue to have nosebleeds.  The shape of your nose does not return to normal within 5 days.  You have pus draining out of your nose. SEEK IMMEDIATE MEDICAL CARE IF:  You have bleeding from your nose that does not stop after you pinch your nostrils closed for 20 minutes and keep ice on your nose.  You have clear fluid draining out of your nose.  You notice a grape-like swelling on the septum. This swelling is a collection of blood (hematoma) that must be drained to help prevent infection.  You have difficulty moving your eyes.  You have repeated vomiting.   This information is not intended to replace advice given to you by your health care provider. Make sure you discuss any questions you have with your health care provider.   Document Released: 02/22/2000 Document Revised: 11/15/2014 Document Reviewed: 04/03/2014 Elsevier  Interactive Patient Education [employer].

## 2015-02-07 NOTE — ED Notes (Signed)
Pt presents with reports of bilateral nasal fractures, missing tooth from fall this morning.  Pt reports tripping on uneven concrete, falling face first; -LOC; seen at urgent Care and referred here.

## 2015-02-07 NOTE — ED Notes (Signed)
Patient transported to CT 

## 2015-02-07 NOTE — Progress Notes (Addendum)
Patient ID: [name] PerchesBarbara Buege, female   DOB: 02-19-1929, 81 y.o.   MRN: [id number]     This chart was scribed for Lesle ChrisSteven Malakai Schoenherr, MD by Littie Deedsichard Sun, Medical Scribe. This patient was seen in room 6 and the patient's care was started at 8:11 AM.   Chief Complaint:  Chief Complaint  Patient presents with  . Fall  . Facial Injury    HPI: [name] is a 81 y.o. female who [facility] today complaining of a fall that occurred this morning. Patient fell and hit her face on concrete ground. She is currently bleeding from her nose. She has also lost her right upper 2nd incisor and has a laceration under her upper lip on the right. Patient denies other head injury. She also denies being on any blood thinners. She is unsure when her last tetanus shot was. [place]    Prior to Admission medications   Medication Sig Start Date End Date Taking? Authorizing Provider  acetaminophen (TYLENOL) 500 MG tablet Take 500 mg by mouth every 6 (six) hours as needed for fever.     Historical Provider, MD  aluminum-magnesium hydroxide-simethicone (MAALOX) 200-200-20 MG/5ML SUSP Take 30 mLs by mouth every 4 (four) hours. X 3 days as of 08/22/14    Historical Provider, MD  aspirin EC 325 MG EC tablet Take 1 tablet (325 mg total) by mouth 2 (two) times daily after a meal. 08/19/14   Kathryne Hitchhristopher Y Blackman, MD  Cholecalciferol (VITAMIN D3) 2000 UNITS capsule Take 2,000 Units by mouth every morning.    Historical Provider, MD  ferrous sulfate 325 (65 FE) MG tablet Take 1 tablet (325 mg total) by mouth 3 (three) times daily after meals. Patient taking differently: Take 325 mg by mouth every morning.  12/26/13   Kathryne Hitchhristopher Y Blackman, MD  Menthol, Topical Analgesic, 154 MG PADS Apply 1 each topically once as needed (pain in knee.).    Historical Provider, MD  metoprolol tartrate (LOPRESSOR) 25 MG tablet Take 25 mg by mouth every morning.    Historical Provider, MD  Multiple Vitamins-Minerals  (ICAPS PO) Take 1 capsule by mouth every morning.    Historical Provider, MD  oxyCODONE-acetaminophen (ROXICET) 5-325 MG per tablet Take 1-2 tablets by mouth every 4 (four) hours as needed. 08/19/14   Kathryne Hitchhristopher Y Blackman, MD  pantoprazole (PROTONIX) 40 MG tablet Take 40 mg by mouth every morning.    Historical Provider, MD  Polyvinyl Alcohol-Povidone (REFRESH OP) Apply 1-2 drops to eye daily as needed (dry eyes.).    Historical Provider, MD  saccharomyces boulardii (FLORASTOR) 250 MG capsule Take 250 mg by mouth every morning.    Historical Provider, MD  vitamin C (VITAMIN C) 500 MG tablet Take 1 tablet (500 mg total) by mouth 2 (two) times daily. 01/11/14   Kathryne Hitchhristopher Y Blackman, MD  zinc sulfate 220 MG capsule Take 1 capsule (220 mg total) by mouth daily. 01/11/14   Kathryne Hitchhristopher Y Blackman, MD     ROS: The patient denies fevers, chills, night sweats, unintentional weight loss, chest pain, palpitations, wheezing, dyspnea on exertion, dysuria, hematuria, melena, numbness, weakness, or tingling.   All other systems have been reviewed and were otherwise negative with the exception of those mentioned in the HPI and as above.    PHYSICAL EXAM: Filed Vitals:   02/07/15 0829  BP: 170/90  Pulse: 90  Temp: 97.7 F (36.5 C)  Resp: 16   There is no weight on file  to calculate BMI.   General: Alert, cooperative. HEENT:  Extensive blood over her face and chin. Extensive swelling over the bridge of her nose. Bleeding from the left side of her nose. Large stellate laceration on the inside of the upper lip on the right. Bleeding from the left side of her nose. The right second upper incisor is missing. The right upper first incisor is loose. Neck: Neck supple. Eye: [facility] Cardiovascular:  Regular rate and rhythm, no rubs murmurs or gallops.  No Carotid bruits, radial pulse intact. No pedal edema.  Respiratory: Clear to auscultation bilaterally.  No wheezes, rales, or rhonchi.  No cyanosis, no  use of accessory musculature Abdominal: No organomegaly, abdomen is soft and non-tender, positive bowel sounds.  No masses. Musculoskeletal: Mild tenderness over the right shoulder. Skin: No rashes. Neurologic: Facial musculature symmetric. Psychiatric: Patient acts appropriately throughout our interaction. Lymphatic: No cervical or submandibular lymphadenopathy    LABS:    EKG/XRAY:   Primary read interpreted by Dr. Cleta Alberts at [facility]. There is a nasal fracture noted. Questionable retained tooth   ASSESSMENT/PLAN: Patient has a nasal fracture bilaterally as well as a fractured tooth right second upper incisor with a large laceration right upper lip. Patient sent to the hospital for evaluation by oral surgery. Tetanus was updated her last tetanus being 8/2 years ago. We attempted to get a drug screen but were unable to get sufficient urine. Patient's blood pressure will also need to be rechecked because it was significantly elevated here in the office.  By signing my name below, I, Littie Deeds, attest that this documentation has been prepared under the direction and in the presence of Lesle Chris, MD.  Electronically Signed: Littie Deeds, Medical Scribe. 02/07/2015. 8:32 AM.   Michaell Cowing sideeffects, risk and benefits, and alternatives of medications d/w patient. Patient is aware that all medications have potential sideeffects and we are unable to predict every sideeffect or drug-drug interaction that may occur.  Lesle Chris MD 02/07/2015 8:32 AM

## 2015-02-07 NOTE — Patient Instructions (Signed)
GO OVER TO [facility] NOW AND REGISTER TO BE SEEN

## 2016-02-22 IMAGING — RF DG C-ARM 1-60 MIN-NO REPORT
1 series · 2 of 2 positions shown · non-contrast
Comparison: none

[Series 1: run · 2 of 2 slices shown]
[im 1/2]
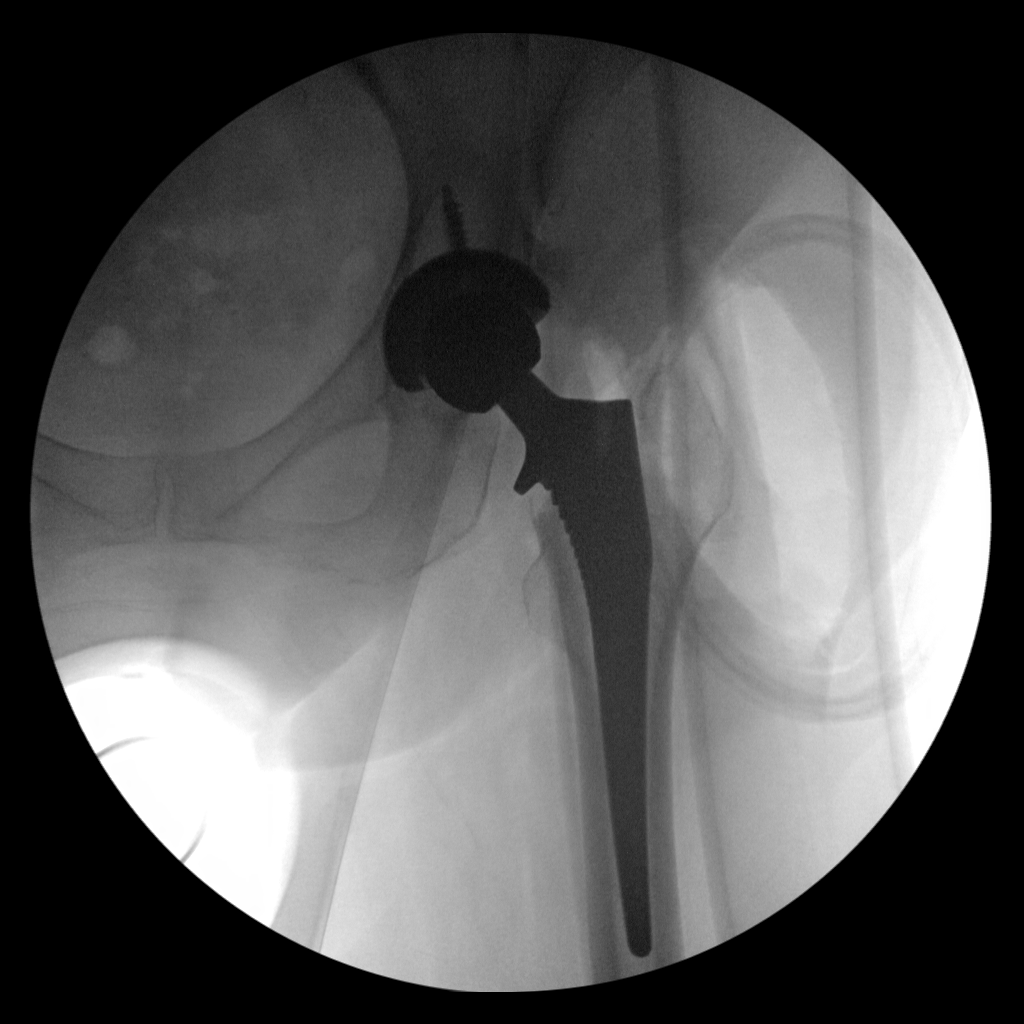
[im 2/2]
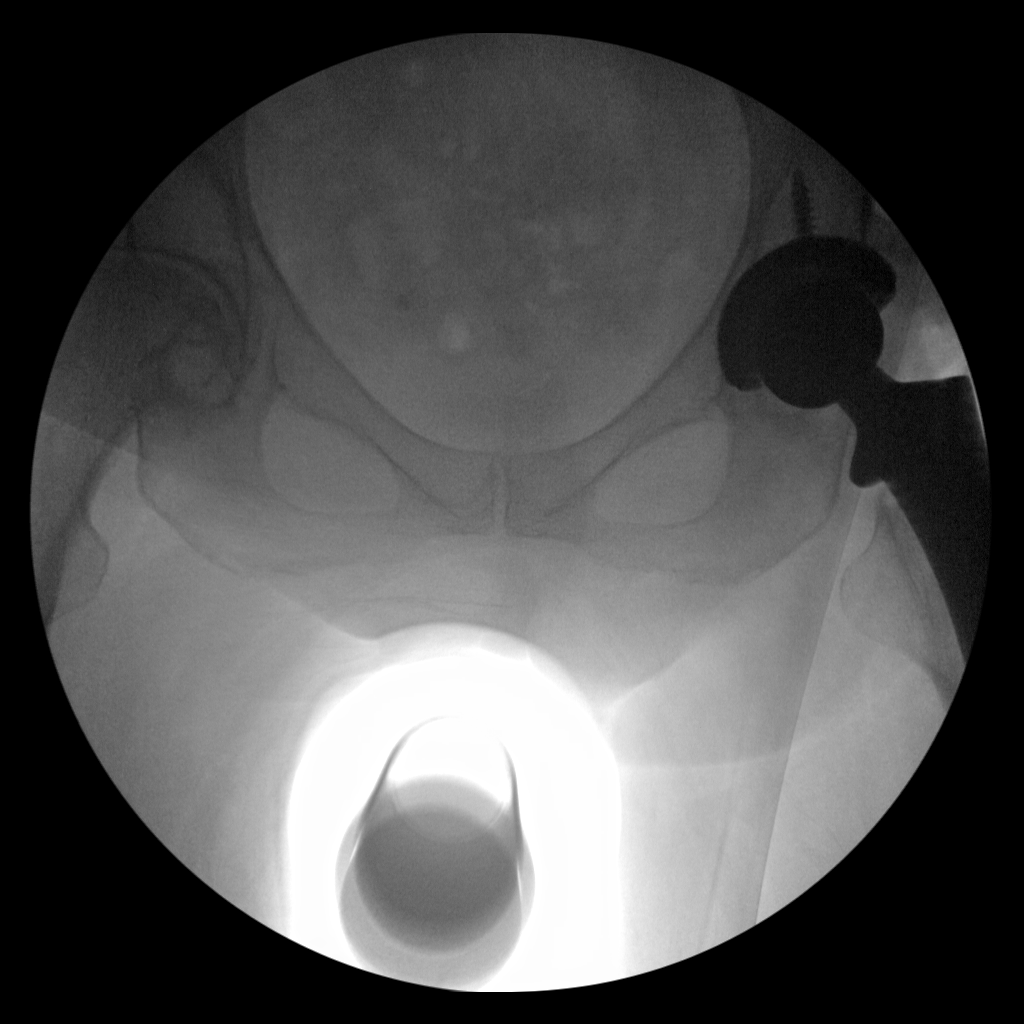

[2 of 2 positions shown; findings below may reference images not displayed]

:
Fluoroscopy was utilized by the requesting physician. No
radiographic interpretation.

## 2016-02-22 IMAGING — CR DG HIP 1V PORT*L*
1 series · 1 of 1 positions shown · non-contrast
Comparison: Postoperative view of the pelvis.

CLINICAL DATA: Left hip replacement.

EXAM:
PORTABLE LEFT HIP - 1 VIEW

[view not recorded]
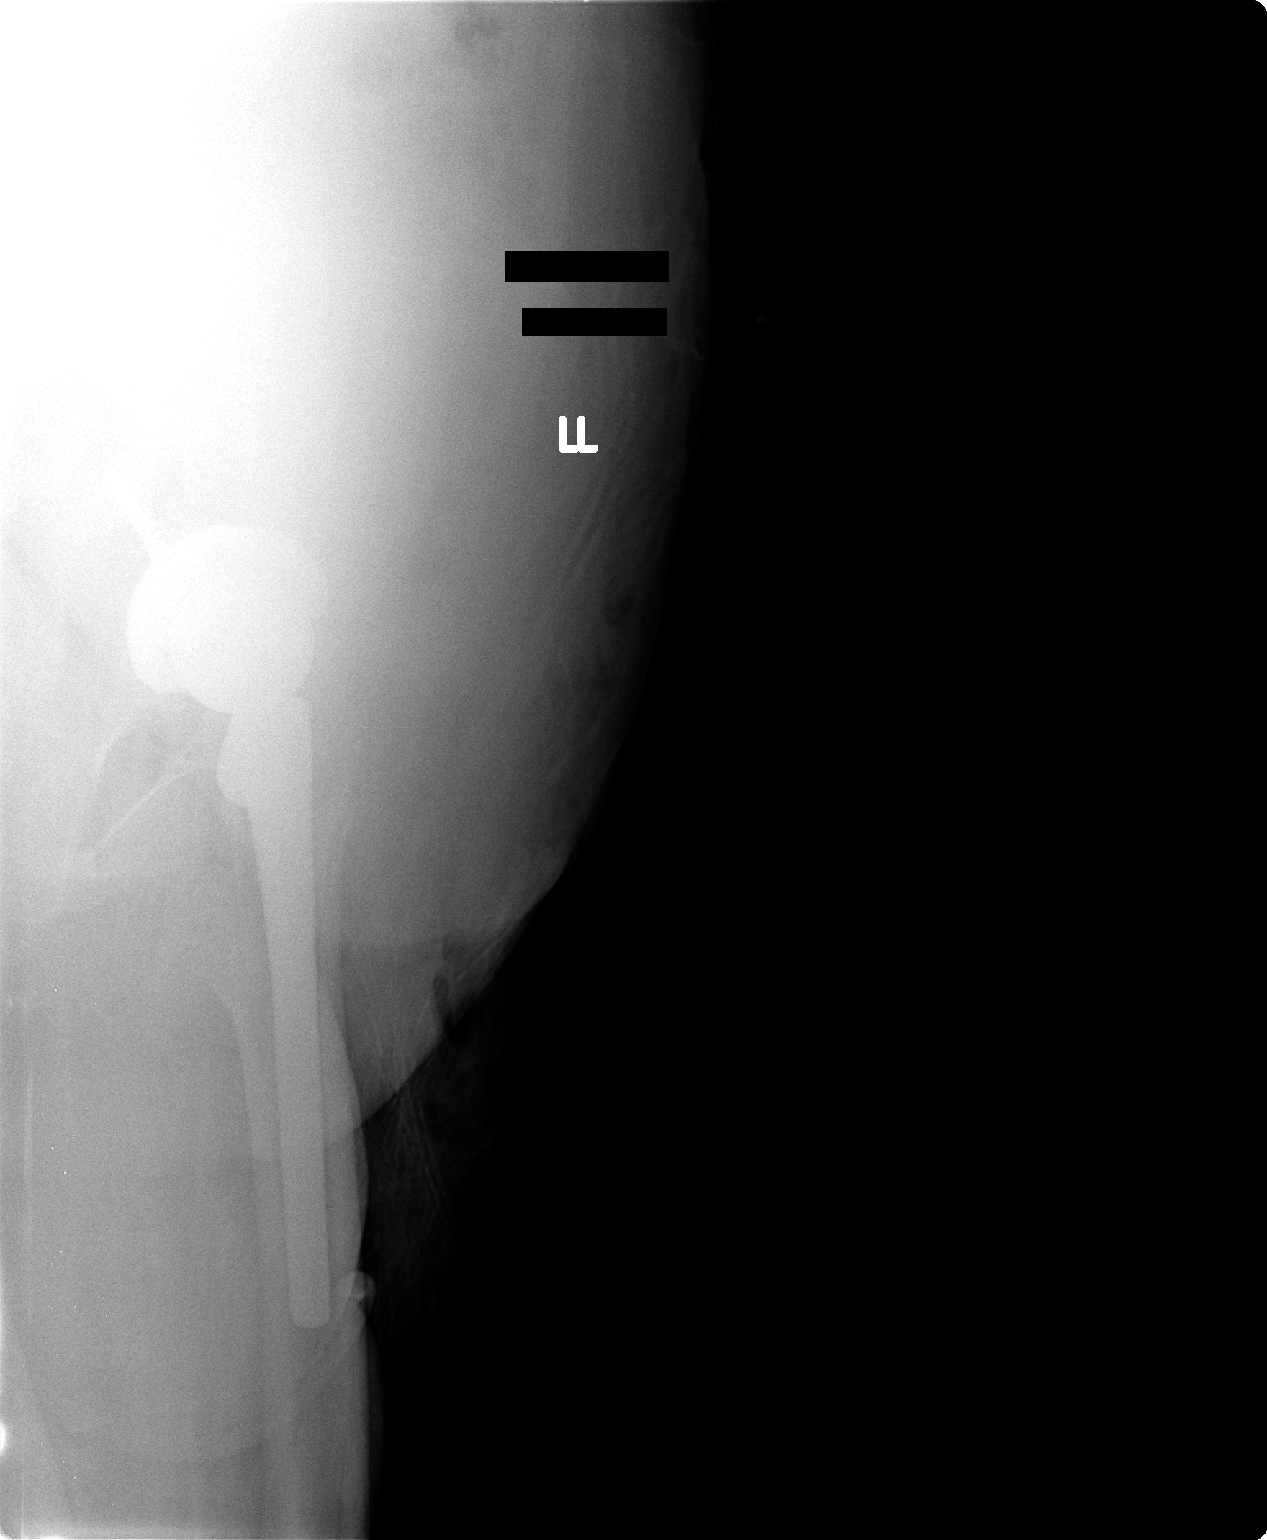

[1 of 1 positions shown; findings below may reference images not displayed]

FINDINGS: Cross-table lateral view of the left hip demonstrates changes of
left hip replacement. Normal alignment. No bony complicating
feature.
IMPRESSION: Left hip replacement.  No complicating feature.

## 2016-06-16 ENCOUNTER — Ambulatory Visit (INDEPENDENT_AMBULATORY_CARE_PROVIDER_SITE_OTHER): Payer: Medicare Other

## 2016-06-16 ENCOUNTER — Encounter (INDEPENDENT_AMBULATORY_CARE_PROVIDER_SITE_OTHER): Payer: Self-pay

## 2016-06-16 ENCOUNTER — Ambulatory Visit (INDEPENDENT_AMBULATORY_CARE_PROVIDER_SITE_OTHER): Payer: Medicare Other | Admitting: Physician Assistant

## 2016-06-16 ENCOUNTER — Encounter (INDEPENDENT_AMBULATORY_CARE_PROVIDER_SITE_OTHER): Payer: Self-pay | Admitting: Physician Assistant

## 2016-06-16 DIAGNOSIS — M170 Bilateral primary osteoarthritis of knee: Secondary | ICD-10-CM

## 2016-06-16 DIAGNOSIS — M25561 Pain in right knee: Secondary | ICD-10-CM

## 2016-06-16 MED ORDER — LIDOCAINE HCL 1 % IJ SOLN
3.0000 mL | INTRAMUSCULAR | Status: AC | PRN
  Administered 2016-06-16: 3 mL

## 2016-06-16 MED ORDER — METHYLPREDNISOLONE ACETATE 40 MG/ML IJ SUSP
40.0000 mg | INTRAMUSCULAR | Status: AC | PRN
  Administered 2016-06-16: 40 mg via INTRA_ARTICULAR

## 2016-06-16 NOTE — Progress Notes (Signed)
Office Visit Note   Patient: [name]           Date of Birth: [birth year]           MRN: [id number] Visit Date: 06/16/2016              Requested by: [place] [address] [place], [place] [address] PCP: Oletha Blend, MD   Assessment & Plan: Visit Diagnoses:  1. Acute pain of right knee   2. Primary osteoarthritis of both knees     Plan: She will ice the knee. She is activities as tolerated. Her daughter works with [place] in the operating room and she can follow up with [place] if that she has any questions or concerns.  Follow-Up Instructions: Return if symptoms worsen or fail to improve.   Orders:  Orders Placed This Encounter  Procedures  . Large Joint Injection/Arthrocentesis  . XR Knee 1-2 Views Right   No orders of the defined types were placed in this encounter.     Procedures: Large Joint Inj Date/Time: 06/16/2016 2:53 PM Performed by: Kirtland Bouchard Authorized by: Kirtland Bouchard   Consent Given by:  Patient Indications:  Pain Location:  Knee Site:  R knee Needle Size:  25 G Approach:  Anterolateral Ultrasound Guidance: No   Fluoroscopic Guidance: No   Medications:  40 mg methylPREDNISolone acetate 40 MG/ML; 3 mL lidocaine 1 % Aspiration Attempted: No   Aspirate amount (mL):  20 Aspirate:  Serous Patient tolerance:  Patient tolerated the procedure well with no immediate complications     Clinical Data: No additional findings.   Subjective: Chief Complaint  Patient presents with  . Right Knee - Pain    HPI [name] is a 83 year old female pain in right knee for a couple weeks. She has what she describes good and bad days. Knee feels tight to her especially on the lateral aspect. She notes some crunching in her knee at times. She's had no known injury to the knee. She does have known arthritis of the knee has had cortisone injection in the past. She is now using knee immobilizer due to the fact that her pain became severe last night  she is unable to trust the knee. She's she's tried Tylenol arthritis with no real relief  Review of Systems No fevers chills shortness breath or calf pain  Objective: Vital Signs: There were no vitals taken for this visit.  Physical Exam  Constitutional: She is oriented to person, place, and time. She appears well-developed and well-nourished. No distress.  Neurological: She is alert and oriented to person, place, and time.  Psychiatric: She has a normal mood and affect.    Ortho Exam Right knee no instability valgus varus stressing. She has full extension flexion to only 90. Tenderness along medial lateral joint line. No abnormal warmth erythema positive mild effusion. Specialty Comments:  No specialty comments available.  Imaging: Xr Knee 1-2 Views Right  Result Date: 06/16/2016 AP and lateral view right knee: No acute fracture. She has moderate medial and lateral compartmental arthritis. Mild patellofemoral arthritis AP of the left knee shows near bone-on-bone medial compartment and lateral compartment of the mild to moderate arthritic changes. No other bony abnormalities noted.    PMFS History: Patient Active Problem List   Diagnosis Date Noted  . Osteoarthritis of right hip 08/18/2014  . Status post total replacement of right hip 08/18/2014  . Postoperative wound infection left hip 01/09/2014  .  Post-operative infection 01/09/2014  . Postoperative anemia due to acute blood loss 12/28/2013  . HTN (hypertension) 12/28/2013  . Duodenal ulcer 12/28/2013  . Arthritis of left hip 12/23/2013  . Status post total replacement of left hip 12/23/2013  . Hematemesis 01/18/2013  . Acute duodenal ulcer with hemorrhage 01/18/2013  . Acute gastric ulcers with hemorrhage 01/18/2013  . GI bleed 01/17/2013  . H/O aortic valve replacement 01/17/2013   Past Medical History:  Diagnosis Date  . Acute duodenal ulcer with hemorrhage 01/18/2013  . Acute gastric ulcers with hemorrhage  01/18/2013  . Anemia   . Aortic valve replaced   . Arthritis    left hip  . Asthma    hx of in childhood   . Colon polyps    type not known, year(s) of occurrence not defined.   Marland Kitchen GERD (gastroesophageal reflux disease)   . Heart murmur   . History of transfusion   . Hypertension   . Nocturia   . Pneumonia     Family History  Problem Relation Age of Onset  . Colon cancer Neg Hx   . Breast cancer Neg Hx   . Throat cancer Neg Hx   . Stomach cancer Neg Hx     Past Surgical History:  Procedure Laterality Date  . CARDIAC CATHETERIZATION    . CATARACT EXTRACTION Bilateral   . DILATION AND CURETTAGE OF UTERUS     x 2.  . ESOPHAGOGASTRODUODENOSCOPY N/A 01/18/2013   Procedure: ESOPHAGOGASTRODUODENOSCOPY (EGD);  Surgeon: Iva Boop, MD;  Location: [facility] ENDOSCOPY;  Service: Endoscopy;  Laterality: N/A;  . INCISION AND DRAINAGE HIP Left 01/09/2014   Procedure: IRRIGATION AND DEBRIDEMENT LEFT HIP;  Surgeon: Kathryne Hitch, MD;  Location: [facility] OR;  Service: Orthopedics;  Laterality: Left;  . TISSUE AORTIC VALVE REPLACEMENT  Nov 18 2012   at [facility]  . TOTAL HIP ARTHROPLASTY Left 12/23/2013   Procedure: TOTAL HIP ARTHROPLASTY ANTERIOR APPROACH, LEFT;  Surgeon: Kathryne Hitch, MD;  Location: [facility] ORS;  Service: Orthopedics;  Laterality: Left;  . TOTAL HIP ARTHROPLASTY Right 08/18/2014   Procedure: RIGHT TOTAL HIP ARTHROPLASTY ANTERIOR APPROACH;  Surgeon: Kathryne Hitch, MD;  Location: [facility] ORS;  Service: Orthopedics;  Laterality: Right;   Social History   Occupational History  . Not on file.   Social History Main Topics  . Smoking status: Never Smoker  . Smokeless tobacco: Never Used  . Alcohol use No  . Drug use: No  . Sexual activity: Not on file

## 2016-06-25 ENCOUNTER — Other Ambulatory Visit (INDEPENDENT_AMBULATORY_CARE_PROVIDER_SITE_OTHER): Payer: Self-pay | Admitting: Physician Assistant

## 2016-06-25 MED ORDER — DICLOFENAC SODIUM 1 % TD GEL: 2.0000 g | Freq: Four times a day (QID) | TRANSDERMAL | 2 refills | Status: DC

## 2016-10-17 IMAGING — DX DG HIP (WITH OR WITHOUT PELVIS) 1V PORT*R*
2 series · 2 of 2 positions shown · non-contrast
Comparison: 12/23/2013

CLINICAL DATA: Postop total right hip replacement.

EXAM:
RIGHT HIP (WITH PELVIS) 1 VIEW PORTABLE

[pelvis ap]
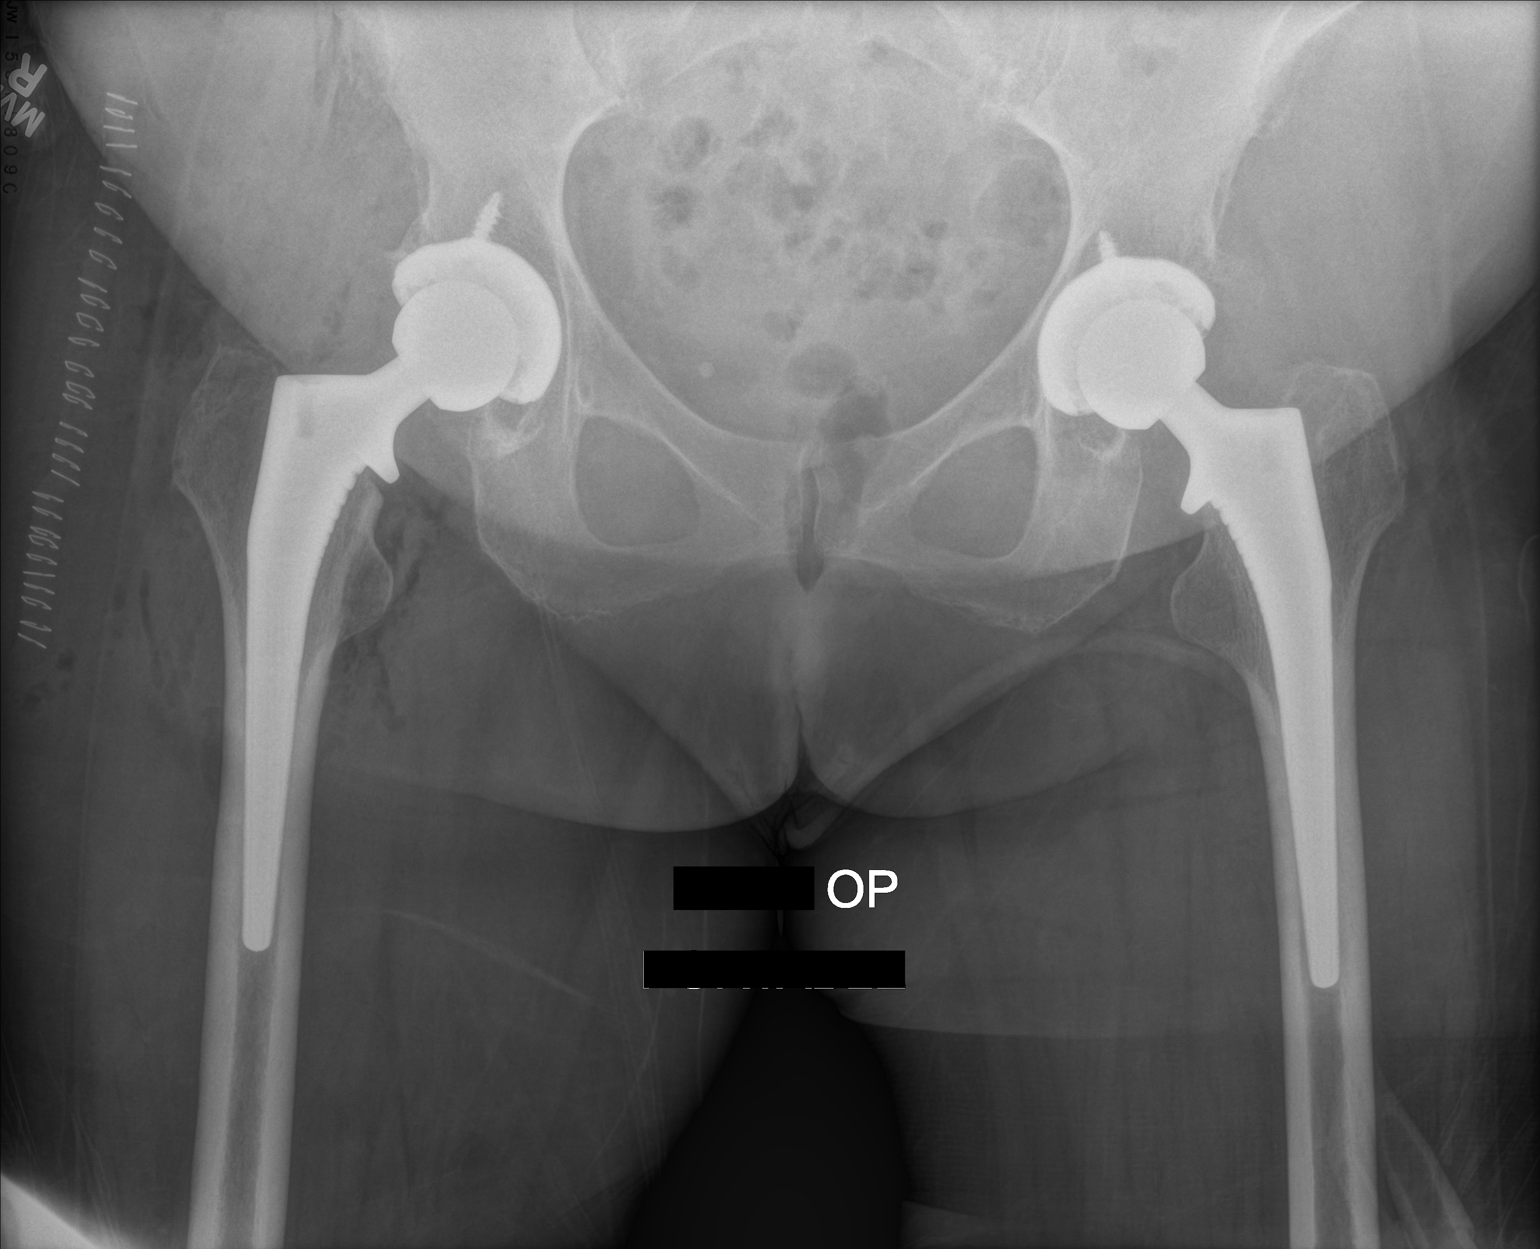

[hip x-table]
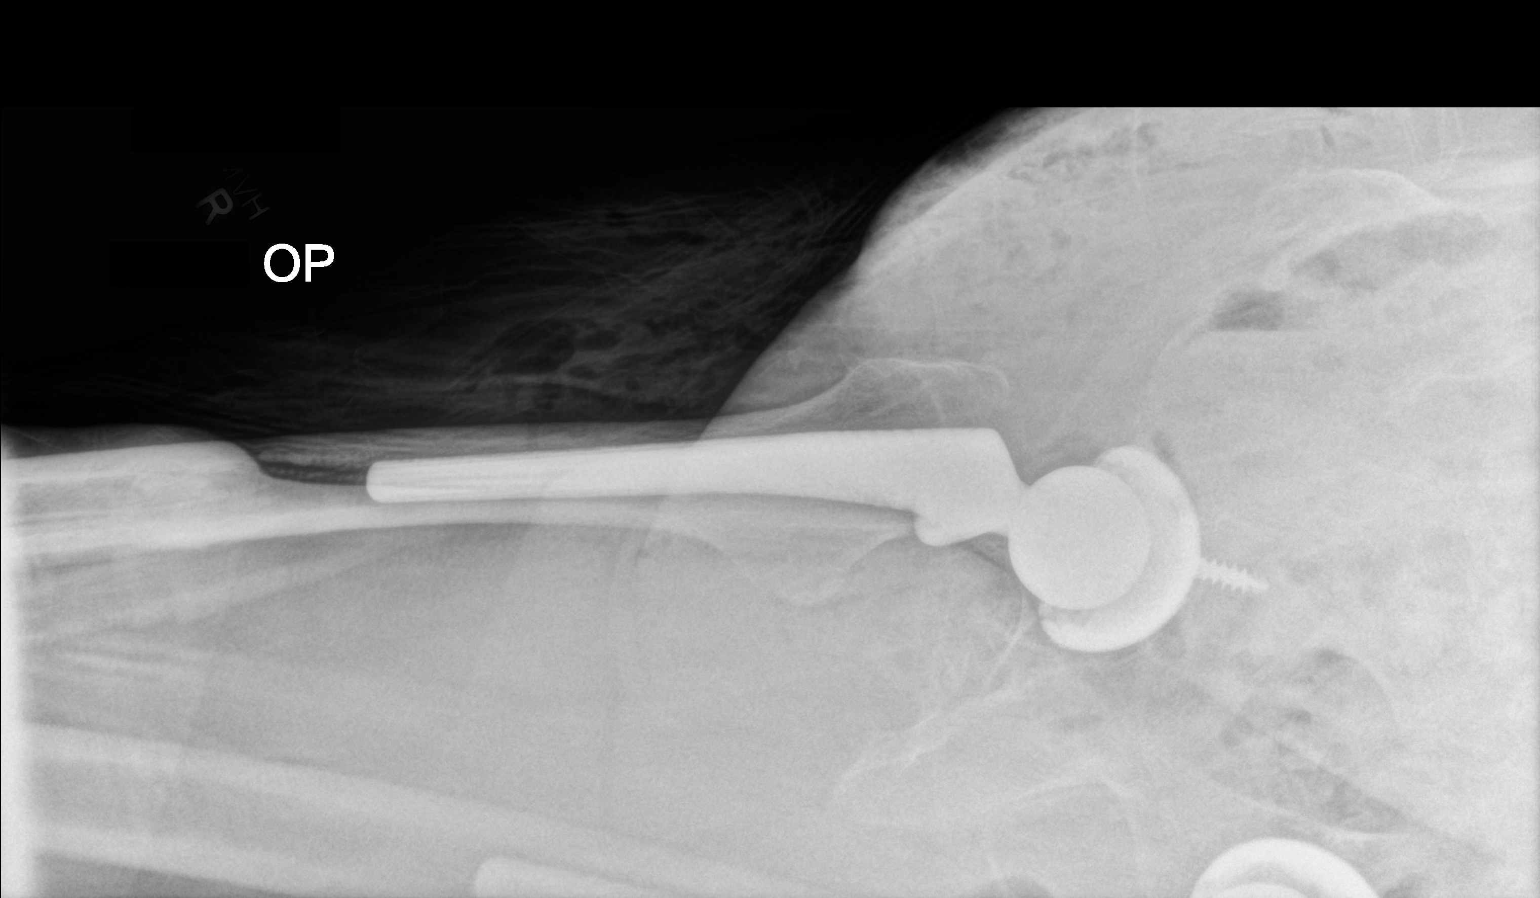

[2 of 2 positions shown; findings below may reference images not displayed]

FINDINGS: A total right hip arthroplasty is well seated. No periprosthetic
fracture. No dislocation. Unremarkable appearance of a pre-existing
total left hip arthroplasty. Expected postoperative gas.
IMPRESSION: Unremarkable bilateral total hip arthroplasty, new on the right.

## 2017-06-30 ENCOUNTER — Other Ambulatory Visit (INDEPENDENT_AMBULATORY_CARE_PROVIDER_SITE_OTHER): Payer: Self-pay

## 2017-06-30 MED ORDER — DICLOFENAC SODIUM 1 % TD GEL: 2.0000 g | Freq: Four times a day (QID) | TRANSDERMAL | 2 refills | Status: AC

## 2019-07-04 ENCOUNTER — Ambulatory Visit (INDEPENDENT_AMBULATORY_CARE_PROVIDER_SITE_OTHER): Payer: Medicare Other | Admitting: Ophthalmology

## 2019-07-04 ENCOUNTER — Other Ambulatory Visit: Payer: Self-pay

## 2019-07-04 ENCOUNTER — Encounter (INDEPENDENT_AMBULATORY_CARE_PROVIDER_SITE_OTHER): Payer: Self-pay | Admitting: Ophthalmology

## 2019-07-04 DIAGNOSIS — H43822 Vitreomacular adhesion, left eye: Secondary | ICD-10-CM | POA: Diagnosis not present

## 2019-07-04 DIAGNOSIS — H353221 Exudative age-related macular degeneration, left eye, with active choroidal neovascularization: Secondary | ICD-10-CM

## 2019-07-04 DIAGNOSIS — H353212 Exudative age-related macular degeneration, right eye, with inactive choroidal neovascularization: Secondary | ICD-10-CM | POA: Insufficient documentation

## 2019-07-04 DIAGNOSIS — H353132 Nonexudative age-related macular degeneration, bilateral, intermediate dry stage: Secondary | ICD-10-CM

## 2019-07-04 MED ORDER — BEVACIZUMAB CHEMO INJECTION 1.25MG/0.05ML SYRINGE FOR KALEIDOSCOPE
1.2500 mg | INTRAVITREAL | Status: AC | PRN
  Administered 2019-07-04: 1.25 mg via INTRAVITREAL

## 2019-07-04 NOTE — Assessment & Plan Note (Signed)
The nature of wet macular degeneration was discussed with the patient.  Forms of therapy reviewed include the use of Anti-VEGF medications injected painlessly into the eye, as well as other possible treatment modalities, including thermal laser therapy. Fellow eye involvement and risks were discussed with the patient. 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 The treatment regimen is on a treat as needed basis with the intent to treat if necessary and extend interval of exams when possible. On average 1 out of 6 patients do not need lifetime therapy. However, the risk of recurrent disease is high for a lifetime.  Initially monthly, then periodic, examinations and evaluations will determine whether the next treatment is required on the day of the examination.  Ocular patient with visual acuity OS stable or slightly improved.  Exam in 58-month interval OU.  Repeat Avastin OS today

## 2019-10-04 ENCOUNTER — Ambulatory Visit (INDEPENDENT_AMBULATORY_CARE_PROVIDER_SITE_OTHER): Payer: Medicare Other | Admitting: Ophthalmology

## 2019-10-04 ENCOUNTER — Other Ambulatory Visit: Payer: Self-pay

## 2019-10-04 ENCOUNTER — Encounter (INDEPENDENT_AMBULATORY_CARE_PROVIDER_SITE_OTHER): Payer: Self-pay | Admitting: Ophthalmology

## 2019-10-04 DIAGNOSIS — H353221 Exudative age-related macular degeneration, left eye, with active choroidal neovascularization: Secondary | ICD-10-CM

## 2019-10-04 MED ORDER — BEVACIZUMAB CHEMO INJECTION 1.25MG/0.05ML SYRINGE FOR KALEIDOSCOPE
1.2500 mg | INTRAVITREAL | Status: AC | PRN
  Administered 2019-10-04: 1.25 mg via INTRAVITREAL

## 2019-10-04 NOTE — Assessment & Plan Note (Signed)
Will repeat intravitreal Avastin OS today,.  The vision is limited by subretinal scarring.  Visit examination in 3 months

## 2019-10-04 NOTE — Progress Notes (Signed)
10/04/2019     CHIEF COMPLAINT Patient presents for Retina Follow Up   HISTORY OF PRESENT ILLNESS: [name] is a 86 y.o. female who presents to the clinic today for:   HPI    Retina Follow Up    Patient presents with  Wet AMD.  In left eye.  This started 3 months ago.  Severity is moderate.  Duration of 3 months.  Since onset it is stable.  I, the attending physician,  performed the HPI with the patient and updated documentation appropriately.          Comments    3 Month AMD F/U OU, poss Avastin OS  Pt reports decreased near [place] OU, especially with small numbers like a phonebook. No other new symptoms reported OU.       Last edited by Edmon Crape, MD on 10/04/2019  1:46 PM. (History)      Referring physician: [place] [address] [place],  [place] [address]  HISTORICAL INFORMATION:   Selected notes from the MEDICAL RECORD NUMBER       CURRENT MEDICATIONS: Current Outpatient Medications (Ophthalmic Drugs)  Medication Sig  . Polyvinyl Alcohol-Povidone (REFRESH OP) Apply 1-2 drops to eye daily as needed (dry eyes.).   No current facility-administered medications for this visit. (Ophthalmic Drugs)   Current Outpatient Medications (Other)  Medication Sig  . acetaminophen (TYLENOL) 325 MG tablet Take by mouth.  Marland Kitchen alendronate (FOSAMAX) 70 MG tablet Take 70 mg by mouth once a week. Take with a full glass of water on an empty stomach.  Marland Kitchen aspirin 81 MG tablet Take 81 mg by mouth daily.  . Cholecalciferol (VITAMIN D3) 2000 UNITS capsule Take 2,000 Units by mouth every morning.  . clindamycin (CLEOCIN) 300 MG capsule Take 1 capsule (300 mg total) by mouth 3 (three) times daily. (Patient not taking: Reported on 06/16/2016)  . diclofenac sodium (VOLTAREN) 1 % GEL Apply 2-4 g topically 4 (four) times daily.  . ferrous sulfate 325 (65 FE) MG tablet Take 1 tablet (325 mg total) by mouth 3 (three) times daily after meals. (Patient not taking: Reported on 06/16/2016)  .  metoprolol tartrate (LOPRESSOR) 25 MG tablet Take 25 mg by mouth every morning.  . Multiple Vitamins-Minerals (ICAPS PO) Take 1 capsule by mouth every morning.  Marland Kitchen oxyCODONE-acetaminophen (PERCOCET/ROXICET) 5-325 MG tablet Take 1 tablet by mouth every 4 (four) hours as needed for severe pain. (Patient not taking: Reported on 06/16/2016)  . pantoprazole (PROTONIX) 40 MG tablet Take 40 mg by mouth every morning.  . saccharomyces boulardii (FLORASTOR) 250 MG capsule Take 250 mg by mouth every morning.  . vitamin C (VITAMIN C) 500 MG tablet Take 1 tablet (500 mg total) by mouth 2 (two) times daily.  Marland Kitchen zinc sulfate 220 MG capsule Take 1 capsule (220 mg total) by mouth daily.   No current facility-administered medications for this visit. (Other)      REVIEW OF SYSTEMS:    ALLERGIES  PAST MEDICAL HISTORY Past Medical History:  Diagnosis Date  . Acute duodenal ulcer with hemorrhage 01/18/2013  . Acute gastric ulcers with hemorrhage 01/18/2013  . Anemia   . Aortic valve replaced   . Arthritis    left hip  . Asthma    hx of in childhood   . Colon polyps    type not known, year(s) of occurrence not defined.   Marland Kitchen GERD (gastroesophageal reflux disease)   . Heart murmur   . History  of transfusion   . Hypertension   . Nocturia   . Pneumonia    Past Surgical History:  Procedure Laterality Date  . CARDIAC CATHETERIZATION    . CATARACT EXTRACTION Bilateral   . DILATION AND CURETTAGE OF UTERUS     x 2.  . ESOPHAGOGASTRODUODENOSCOPY N/A 01/18/2013   Procedure: ESOPHAGOGASTRODUODENOSCOPY (EGD);  Surgeon: Iva Boop, MD;  Location: [facility] ENDOSCOPY;  Service: Endoscopy;  Laterality: N/A;  . INCISION AND DRAINAGE HIP Left 01/09/2014   Procedure: IRRIGATION AND DEBRIDEMENT LEFT HIP;  Surgeon: Kathryne Hitch, MD;  Location: [facility] OR;  Service: Orthopedics;  Laterality: Left;  . TISSUE AORTIC VALVE REPLACEMENT  Nov 18 2012   at [facility]  . TOTAL HIP ARTHROPLASTY Left 12/23/2013    Procedure: TOTAL HIP ARTHROPLASTY ANTERIOR APPROACH, LEFT;  Surgeon: Kathryne Hitch, MD;  Location: [facility] ORS;  Service: Orthopedics;  Laterality: Left;  . TOTAL HIP ARTHROPLASTY Right 08/18/2014   Procedure: RIGHT TOTAL HIP ARTHROPLASTY ANTERIOR APPROACH;  Surgeon: Kathryne Hitch, MD;  Location: [facility] ORS;  Service: Orthopedics;  Laterality: Right;    FAMILY HISTORY Family History  Problem Relation Age of Onset  . Colon cancer Neg Hx   . Breast cancer Neg Hx   . Throat cancer Neg Hx   . Stomach cancer Neg Hx     SOCIAL HISTORY Social History   Tobacco Use  . Smoking status: Never Smoker  . Smokeless tobacco: Never Used  Substance Use Topics  . Alcohol use: No  . Drug use: No         OPHTHALMIC EXAM:  Base Eye Exam    Visual Acuity (ETDRS)      Right Left   Dist [place] CF @ face 20/200   Dist ph [place] NI 20/70  Pt forgot glasses        Tonometry (Tonopen, 1:05 PM)      Right Left   Pressure 12 13       Pupils      Dark Light Shape React APD   Right 3 2 Round Brisk None   Left 2 1 Round Brisk None       Visual Fields (Counting fingers)      Left Right    Full Full       Extraocular Movement      Right Left    Full Full       Neuro/Psych    Oriented x3: Yes   Mood/Affect: Normal       Dilation    Both eyes: 1.0% Mydriacyl, 2.5% Phenylephrine @ 1:05 PM        Slit Lamp and Fundus Exam    External Exam      Right Left   External Normal Normal       Slit Lamp Exam      Right Left   Lids/Lashes Normal Normal   Conjunctiva/Sclera White and quiet White and quiet   Cornea Clear Clear   Anterior Chamber Deep and quiet Deep and quiet   Iris Round and reactive Round and reactive   Lens Posterior chamber intraocular lens, Open posterior capsule Posterior chamber intraocular lens, Open posterior capsule   Anterior Vitreous Normal Normal       Fundus Exam      Right Left   Posterior Vitreous Posterior vitreous detachment Posterior vitreous  detachment   Disc Peripapillary atrophy Peripapillary atrophy   C/D Ratio 0.3 0.3   Macula Cystoid macular edema, Disciform scar, Advanced age related  macular degeneration, Retinal pigment epithelial atrophy, Geographic atrophy, Subretinal fibrosis Atrophy, Macular atrophy, Retinal pigment epithelial atrophy, Macular thickening, Cystoid macular edema   Vessels Normal Normal   Periphery Normal Normal          IMAGING AND PROCEDURES  Imaging and Procedures for 10/04/19  OCT, Retina - OU - Both Eyes       Right Eye Quality was good. Scan locations included subfoveal. Central Foveal Thickness: 882. Progression has been stable. Findings include disciform scar, intraretinal fluid, cystoid macular edema.   Left Eye Central Foveal Thickness: 224. Progression has been stable. Findings include subretinal hyper-reflective material, retinal drusen , no SRF, no IRF, vitreomacular adhesion .   Notes Less subretinal fluid OS, with subfoveal drusen limiting the acuity, will repeat injection intravitreal Avastin OS today at 6-month interval       Intravitreal Injection, Pharmacologic Agent - OS - Left Eye       Time Out 10/04/2019. 1:42 PM. Confirmed correct patient, procedure, site, and patient consented.   Anesthesia Topical anesthesia was used. Anesthetic medications included Akten 3.5%.   Procedure Preparation included Tobramycin 0.3%, Ofloxacin , 10% betadine to eyelids, 5% betadine to ocular surface. A 30 gauge needle was used.   Injection:  1.25 mg Bevacizumab (AVASTIN) SOLN   NDC: [address]-9, Lot: [id number]   Route: Intravitreal, Site: Left Eye, Waste: 0 mg  Post-op Post injection exam found visual acuity of at least counting fingers. The patient tolerated the procedure well. There were no complications. The patient received written and verbal post procedure care education. Post injection medications were not given.                 ASSESSMENT/PLAN:  Exudative  age-related macular degeneration of left eye with active choroidal neovascularization (HCC) Will repeat intravitreal Avastin OS today,.  The vision is limited by subretinal scarring.  Visit examination in 3 months      ICD-10-CM   1. Exudative age-related macular degeneration of left eye with active choroidal neovascularization (HCC)  H35.3221 OCT, Retina - OU - Both Eyes    Intravitreal Injection, Pharmacologic Agent - OS - Left Eye    Bevacizumab (AVASTIN) SOLN 1.25 mg    1.  Goal for the left eye is to maintain the best visual acuity, and prevent recurrence of CNVM  2.  3.  Ophthalmic Meds Ordered this visit:  Meds ordered this encounter  Medications  . Bevacizumab (AVASTIN) SOLN 1.25 mg       Return in about 3 months (around 01/04/2020) for DILATE OU, AVASTIN OCT, OS, COLOR FP.  There are no Patient Instructions on file for this visit.   Explained the diagnoses, plan, and follow up with the patient and they expressed understanding.  Patient expressed understanding of the importance of proper follow up care.   Alford Highland Anitria Andon M.D. Diseases & Surgery of the Retina and Vitreous [facility] 10/04/19     Abbreviations: M myopia (nearsighted); A astigmatism; H hyperopia (farsighted); P presbyopia; Mrx spectacle prescription;  CTL contact lenses; OD right eye; OS left eye; OU both eyes  XT exotropia; ET esotropia; PEK punctate epithelial keratitis; PEE punctate epithelial erosions; DES dry eye syndrome; MGD meibomian gland dysfunction; ATs artificial tears; PFAT's preservative free artificial tears; NSC nuclear sclerotic cataract; PSC posterior subcapsular cataract; ERM epi-retinal membrane; PVD posterior vitreous detachment; RD retinal detachment; DM diabetes mellitus; DR diabetic retinopathy; NPDR non-proliferative diabetic retinopathy; PDR proliferative diabetic retinopathy; CSME clinically significant macular edema; DME diabetic  macular edema; dbh dot blot  hemorrhages; CWS cotton wool spot; POAG primary open angle glaucoma; C/D cup-to-disc ratio; HVF humphrey visual field; GVF goldmann visual field; OCT optical coherence tomography; IOP intraocular pressure; BRVO Branch retinal vein occlusion; CRVO central retinal vein occlusion; CRAO central retinal artery occlusion; BRAO branch retinal artery occlusion; RT retinal tear; SB scleral buckle; PPV pars plana vitrectomy; VH Vitreous hemorrhage; PRP panretinal laser photocoagulation; IVK intravitreal kenalog; VMT vitreomacular traction; MH Macular hole;  NVD neovascularization of the disc; NVE neovascularization elsewhere; AREDS age related eye disease study; ARMD age related macular degeneration; POAG primary open angle glaucoma; EBMD epithelial/anterior basement membrane dystrophy; ACIOL anterior chamber intraocular lens; IOL intraocular lens; PCIOL posterior chamber intraocular lens; Phaco/IOL phacoemulsification with intraocular lens placement; PRK photorefractive keratectomy; LASIK laser assisted in situ keratomileusis; HTN hypertension; DM diabetes mellitus; COPD chronic obstructive pulmonary disease

## 2020-01-03 ENCOUNTER — Ambulatory Visit (INDEPENDENT_AMBULATORY_CARE_PROVIDER_SITE_OTHER): Payer: Medicare Other | Admitting: Ophthalmology

## 2020-01-03 ENCOUNTER — Other Ambulatory Visit: Payer: Self-pay

## 2020-01-03 ENCOUNTER — Encounter (INDEPENDENT_AMBULATORY_CARE_PROVIDER_SITE_OTHER): Payer: Self-pay | Admitting: Ophthalmology

## 2020-01-03 DIAGNOSIS — H353212 Exudative age-related macular degeneration, right eye, with inactive choroidal neovascularization: Secondary | ICD-10-CM | POA: Diagnosis not present

## 2020-01-03 DIAGNOSIS — H353221 Exudative age-related macular degeneration, left eye, with active choroidal neovascularization: Secondary | ICD-10-CM

## 2020-01-03 DIAGNOSIS — H43822 Vitreomacular adhesion, left eye: Secondary | ICD-10-CM | POA: Diagnosis not present

## 2020-01-03 MED ORDER — BEVACIZUMAB CHEMO INJECTION 1.25MG/0.05ML SYRINGE FOR KALEIDOSCOPE
1.2500 mg | INTRAVITREAL | Status: AC | PRN
  Administered 2020-01-03: 1.25 mg via INTRAVITREAL

## 2020-01-03 NOTE — Progress Notes (Signed)
01/03/2020     CHIEF COMPLAINT Patient presents for Retina Follow Up   HISTORY OF PRESENT ILLNESS: [name] is a 86 y.o. female who presents to the clinic today for:   HPI    Retina Follow Up    Patient presents with  Wet AMD.  In left eye.  This started 3 months ago.  Severity is mild.  Duration of 3 months.  Since onset it is stable.          Comments    3 Month F/U OU, poss Avastin OS  Pt denies noticeable changes to [place] OU since last visit. Pt denies ocular pain, flashes of light, or floaters OU.         Last edited by Ileana Roup, COA on 01/03/2020  1:04 PM. (History)      Referring physician: [place] [address] [place],  [place] [address]  HISTORICAL INFORMATION:   Selected notes from the MEDICAL RECORD NUMBER       CURRENT MEDICATIONS: Current Outpatient Medications (Ophthalmic Drugs)  Medication Sig  . Polyvinyl Alcohol-Povidone (REFRESH OP) Apply 1-2 drops to eye daily as needed (dry eyes.).   No current facility-administered medications for this visit. (Ophthalmic Drugs)   Current Outpatient Medications (Other)  Medication Sig  . acetaminophen (TYLENOL) 325 MG tablet Take by mouth.  Marland Kitchen alendronate (FOSAMAX) 70 MG tablet Take 70 mg by mouth once a week. Take with a full glass of water on an empty stomach.  Marland Kitchen aspirin 81 MG tablet Take 81 mg by mouth daily.  . Cholecalciferol (VITAMIN D3) 2000 UNITS capsule Take 2,000 Units by mouth every morning.  . clindamycin (CLEOCIN) 300 MG capsule Take 1 capsule (300 mg total) by mouth 3 (three) times daily. (Patient not taking: Reported on 06/16/2016)  . diclofenac sodium (VOLTAREN) 1 % GEL Apply 2-4 g topically 4 (four) times daily.  . ferrous sulfate 325 (65 FE) MG tablet Take 1 tablet (325 mg total) by mouth 3 (three) times daily after meals. (Patient not taking: Reported on 06/16/2016)  . metoprolol tartrate (LOPRESSOR) 25 MG tablet Take 25 mg by mouth every morning.  . Multiple Vitamins-Minerals  (ICAPS PO) Take 1 capsule by mouth every morning.  Marland Kitchen oxyCODONE-acetaminophen (PERCOCET/ROXICET) 5-325 MG tablet Take 1 tablet by mouth every 4 (four) hours as needed for severe pain. (Patient not taking: Reported on 06/16/2016)  . pantoprazole (PROTONIX) 40 MG tablet Take 40 mg by mouth every morning.  . saccharomyces boulardii (FLORASTOR) 250 MG capsule Take 250 mg by mouth every morning.  . vitamin C (VITAMIN C) 500 MG tablet Take 1 tablet (500 mg total) by mouth 2 (two) times daily.  Marland Kitchen zinc sulfate 220 MG capsule Take 1 capsule (220 mg total) by mouth daily.   No current facility-administered medications for this visit. (Other)      REVIEW OF SYSTEMS:    ALLERGIES   PAST MEDICAL HISTORY Past Medical History:  Diagnosis Date  . Acute duodenal ulcer with hemorrhage 01/18/2013  . Acute gastric ulcers with hemorrhage 01/18/2013  . Anemia   . Aortic valve replaced   . Arthritis    left hip  . Asthma    hx of in childhood   . Colon polyps    type not known, year(s) of occurrence not defined.   Marland Kitchen GERD (gastroesophageal reflux disease)   . Heart murmur   . History of transfusion   . Hypertension   . Nocturia   . Pneumonia  Past Surgical History:  Procedure Laterality Date  . CARDIAC CATHETERIZATION    . CATARACT EXTRACTION Bilateral   . DILATION AND CURETTAGE OF UTERUS     x 2.  . ESOPHAGOGASTRODUODENOSCOPY N/A 01/18/2013   Procedure: ESOPHAGOGASTRODUODENOSCOPY (EGD);  Surgeon: Iva Boop, MD;  Location: [facility] ENDOSCOPY;  Service: Endoscopy;  Laterality: N/A;  . INCISION AND DRAINAGE HIP Left 01/09/2014   Procedure: IRRIGATION AND DEBRIDEMENT LEFT HIP;  Surgeon: Kathryne Hitch, MD;  Location: [facility] OR;  Service: Orthopedics;  Laterality: Left;  . TISSUE AORTIC VALVE REPLACEMENT  Nov 18 2012   at [facility]  . TOTAL HIP ARTHROPLASTY Left 12/23/2013   Procedure: TOTAL HIP ARTHROPLASTY ANTERIOR APPROACH, LEFT;  Surgeon: Kathryne Hitch, MD;  Location: [facility] ORS;   Service: Orthopedics;  Laterality: Left;  . TOTAL HIP ARTHROPLASTY Right 08/18/2014   Procedure: RIGHT TOTAL HIP ARTHROPLASTY ANTERIOR APPROACH;  Surgeon: Kathryne Hitch, MD;  Location: [facility] ORS;  Service: Orthopedics;  Laterality: Right;    FAMILY HISTORY Family History  Problem Relation Age of Onset  . Colon cancer Neg Hx   . Breast cancer Neg Hx   . Throat cancer Neg Hx   . Stomach cancer Neg Hx     SOCIAL HISTORY Social History   Tobacco Use  . Smoking status: Never Smoker  . Smokeless tobacco: Never Used  Substance Use Topics  . Alcohol use: No  . Drug use: No         OPHTHALMIC EXAM:  Base Eye Exam    Visual Acuity (ETDRS)      Right Left   Dist [place] CF @ 1' 20/100 -2   Dist ph [place] NI 20/70 -2       Tonometry (Tonopen, 1:04 PM)      Right Left   Pressure 11 12       Pupils      Pupils Dark Light Shape React APD   Right PERRL 2 1 Round Brisk None   Left PERRL 2 1 Round Brisk None       Visual Fields (Counting fingers)      Left Right    Full Full       Extraocular Movement      Right Left    Full Full       Neuro/Psych    Oriented x3: Yes   Mood/Affect: Normal       Dilation    Both eyes: 1.0% Mydriacyl, 2.5% Phenylephrine @ 1:08 PM        Slit Lamp and Fundus Exam    External Exam      Right Left   External Normal Normal       Slit Lamp Exam      Right Left   Lids/Lashes Normal Normal   Conjunctiva/Sclera White and quiet White and quiet   Cornea Clear Clear   Anterior Chamber Deep and quiet Deep and quiet   Iris Round and reactive Round and reactive   Lens Posterior chamber intraocular lens, Open posterior capsule Posterior chamber intraocular lens, Open posterior capsule   Anterior Vitreous Normal Normal       Fundus Exam      Right Left   Posterior Vitreous Posterior vitreous detachment Posterior vitreous detachment   Disc Peripapillary atrophy Peripapillary atrophy   C/D Ratio 0.3 0.3   Macula Cystoid macular edema,  Disciform scar, Advanced age related macular degeneration, Retinal pigment epithelial atrophy, Geographic atrophy, Subretinal fibrosis Atrophy, Macular atrophy, Retinal pigment epithelial atrophy, ,  no macular thickening, no hemorrhage, Soft drusen   Vessels Normal Normal   Periphery Normal Normal          IMAGING AND PROCEDURES  Imaging and Procedures for 01/03/20  OCT, Retina - OU - Both Eyes       Right Eye Quality was good. Scan locations included subfoveal. Central Foveal Thickness: 963. Progression has been stable. Findings include abnormal foveal contour, retinal drusen , cystoid macular edema.   Left Eye Quality was good. Scan locations included subfoveal. Central Foveal Thickness: 229. Progression has been stable. Findings include abnormal foveal contour, subretinal hyper-reflective material, vitreomacular adhesion .   Notes Atrophic cystoid macular edema the right eye.  OS with subretinal hyper reflective material.,  No active intraretinal fluid today some foveal atrophy exists       Color Fundus Photography Optos - OU - Both Eyes       Right Eye Macula : exudates, hemorrhage, edema. Vessels : normal observations. Periphery : normal observations.   Left Eye Progression has improved. Disc findings include normal observations. Macula : drusen, geographic atrophy, retinal pigment epithelium abnormalities. Vessels : normal observations. Periphery : normal observations.   Notes OD, large disciform macular scar, no change, left eye with geographic atrophy paracentral       Intravitreal Injection, Pharmacologic Agent - OS - Left Eye       Time Out 01/03/2020. 2:09 PM. Confirmed correct patient, procedure, site, and patient consented.   Anesthesia Topical anesthesia was used. Anesthetic medications included Akten 3.5%.   Procedure Preparation included Tobramycin 0.3%, Ofloxacin , 10% betadine to eyelids, 5% betadine to ocular surface. A 30 gauge needle was  used.   Injection:  1.25 mg Bevacizumab (AVASTIN) SOLN   NDC: 70360-001-02, Lot: [id number]   Route: Intravitreal, Site: Left Eye, Waste: 0 mg  Post-op Post injection exam found visual acuity of at least counting fingers. The patient tolerated the procedure well. There were no complications. The patient received written and verbal post procedure care education. Post injection medications were not given.                 ASSESSMENT/PLAN:  Exudative age-related macular degeneration of left eye with active choroidal neovascularization (HCC) OS, currently with much less active CNVM activity with outer retinal scarring, outer retinal tubulation's  And disruption of the photoreceptor layer as a cause for visual acuity, yet with no signs of intraretinal fluid nor subretinal fluid stable at 4461-month interval post Avastin.  Exudative age-related macular degeneration of right eye with inactive choroidal neovascularization (HCC) Large central disciform scar, and active and stable with atrophic retina overlying      ICD-10-CM   1. Exudative age-related macular degeneration of left eye with active choroidal neovascularization (HCC)  H35.3221 OCT, Retina - OU - Both Eyes    Color Fundus Photography Optos - OU - Both Eyes    Intravitreal Injection, Pharmacologic Agent - OS - Left Eye    Bevacizumab (AVASTIN) SOLN 1.25 mg  2. Vitreomacular adhesion, left  H43.822   3. Exudative age-related macular degeneration of right eye with inactive choroidal neovascularization (HCC)  H35.3212     1.  Repeat intravitreal Avastin OS today to maintain  2.  Repeat examination OU in 3 months  3.  Ophthalmic Meds Ordered this visit:  Meds ordered this encounter  Medications  . Bevacizumab (AVASTIN) SOLN 1.25 mg       Return in about 3 months (around 04/04/2020) for DILATE OU, COLOR FP, OCT, AVASTIN OCT,  OS.  There are no Patient Instructions on file for this visit.   Explained the diagnoses, plan, and  follow up with the patient and they expressed understanding.  Patient expressed understanding of the importance of proper follow up care.   Alford Highland Michaele Amundson M.D. Diseases & Surgery of the Retina and Vitreous [facility] 01/03/20     Abbreviations: M myopia (nearsighted); A astigmatism; H hyperopia (farsighted); P presbyopia; Mrx spectacle prescription;  CTL contact lenses; OD right eye; OS left eye; OU both eyes  XT exotropia; ET esotropia; PEK punctate epithelial keratitis; PEE punctate epithelial erosions; DES dry eye syndrome; MGD meibomian gland dysfunction; ATs artificial tears; PFAT's preservative free artificial tears; NSC nuclear sclerotic cataract; PSC posterior subcapsular cataract; ERM epi-retinal membrane; PVD posterior vitreous detachment; RD retinal detachment; DM diabetes mellitus; DR diabetic retinopathy; NPDR non-proliferative diabetic retinopathy; PDR proliferative diabetic retinopathy; CSME clinically significant macular edema; DME diabetic macular edema; dbh dot blot hemorrhages; CWS cotton wool spot; POAG primary open angle glaucoma; C/D cup-to-disc ratio; HVF humphrey visual field; GVF goldmann visual field; OCT optical coherence tomography; IOP intraocular pressure; BRVO Branch retinal vein occlusion; CRVO central retinal vein occlusion; CRAO central retinal artery occlusion; BRAO branch retinal artery occlusion; RT retinal tear; SB scleral buckle; PPV pars plana vitrectomy; VH Vitreous hemorrhage; PRP panretinal laser photocoagulation; IVK intravitreal kenalog; VMT vitreomacular traction; MH Macular hole;  NVD neovascularization of the disc; NVE neovascularization elsewhere; AREDS age related eye disease study; ARMD age related macular degeneration; POAG primary open angle glaucoma; EBMD epithelial/anterior basement membrane dystrophy; ACIOL anterior chamber intraocular lens; IOL intraocular lens; PCIOL posterior chamber intraocular lens; Phaco/IOL  phacoemulsification with intraocular lens placement; PRK photorefractive keratectomy; LASIK laser assisted in situ keratomileusis; HTN hypertension; DM diabetes mellitus; COPD chronic obstructive pulmonary disease

## 2020-01-03 NOTE — Assessment & Plan Note (Signed)
OS, currently with much less active CNVM activity with outer retinal scarring, outer retinal tubulation's  And disruption of the photoreceptor layer as a cause for visual acuity, yet with no signs of intraretinal fluid nor subretinal fluid stable at 80-month interval post Avastin.

## 2020-01-03 NOTE — Assessment & Plan Note (Signed)
Large central disciform scar, and active and stable with atrophic retina overlying

## 2020-04-03 ENCOUNTER — Ambulatory Visit (INDEPENDENT_AMBULATORY_CARE_PROVIDER_SITE_OTHER): Payer: Medicare Other | Admitting: Ophthalmology

## 2020-04-03 ENCOUNTER — Other Ambulatory Visit: Payer: Self-pay

## 2020-04-03 ENCOUNTER — Encounter (INDEPENDENT_AMBULATORY_CARE_PROVIDER_SITE_OTHER): Payer: Self-pay | Admitting: Ophthalmology

## 2020-04-03 DIAGNOSIS — H353221 Exudative age-related macular degeneration, left eye, with active choroidal neovascularization: Secondary | ICD-10-CM | POA: Diagnosis not present

## 2020-04-03 DIAGNOSIS — H353212 Exudative age-related macular degeneration, right eye, with inactive choroidal neovascularization: Secondary | ICD-10-CM | POA: Diagnosis not present

## 2020-04-03 MED ORDER — BEVACIZUMAB 2.5 MG/0.1ML IZ SOSY
2.5000 mg | PREFILLED_SYRINGE | INTRAVITREAL | Status: AC | PRN
  Administered 2020-04-03: 2.5 mg via INTRAVITREAL

## 2020-04-03 NOTE — Assessment & Plan Note (Signed)
No active disease no extension no enlargement of lesion centrally OD not treatable

## 2020-04-03 NOTE — Assessment & Plan Note (Signed)
The nature of wet macular degeneration was discussed with the patient.  Forms of therapy reviewed include the use of Anti-VEGF medications injected painlessly into the eye, as well as other possible treatment modalities, including thermal laser therapy. Fellow eye involvement and risks were discussed with the patient. 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 The treatment regimen is on a treat as needed basis with the intent to treat if necessary and extend interval of exams when possible. On average 1 out of 6 patients do not need lifetime therapy. However, the risk of recurrent disease is high for a lifetime.  Initially monthly, then periodic, examinations and evaluations will determine whether the next treatment is required on the day of the examination.  OS stable, at 69-month interval.  We will repeat injection today and examine again in 4 months

## 2020-04-03 NOTE — Progress Notes (Signed)
04/03/2020     CHIEF COMPLAINT Patient presents for Retina Follow Up (3 Month F/U OU, poss Avastin OS//Pt c/o scratchiness off and on OS. Pt sts new glasses are helping her to see more clearly OU.)   HISTORY OF PRESENT ILLNESS: [name] is a 87 y.o. female who presents to the clinic today for:   HPI    Retina Follow Up    Patient presents with  Wet AMD.  In left eye.  This started 3 months ago.  Severity is mild.  Duration of 3 months.  Since onset it is stable. Additional comments: 3 Month F/U OU, poss Avastin OS  Pt c/o scratchiness off and on OS. Pt sts new glasses are helping her to see more clearly OU.       Last edited by Ileana Roup, COA on 04/03/2020  1:12 PM. (History)      Referring [place]: [place] [address] [place],  [place] [address]  HISTORICAL INFORMATION:   Selected notes from the MEDICAL RECORD NUMBER       CURRENT MEDICATIONS: Current Outpatient Medications (Ophthalmic Drugs)  Medication Sig  . Polyvinyl Alcohol-Povidone (REFRESH OP) Apply 1-2 drops to eye daily as needed (dry eyes.).   No current facility-administered medications for this visit. (Ophthalmic Drugs)   Current Outpatient Medications (Other)  Medication Sig  . acetaminophen (TYLENOL) 325 MG tablet Take by mouth.  Marland Kitchen alendronate (FOSAMAX) 70 MG tablet Take 70 mg by mouth once a week. Take with a full glass of water on an empty stomach.  Marland Kitchen aspirin 81 MG tablet Take 81 mg by mouth daily.  . Cholecalciferol (VITAMIN D3) 2000 UNITS capsule Take 2,000 Units by mouth every morning.  . clindamycin (CLEOCIN) 300 MG capsule Take 1 capsule (300 mg total) by mouth 3 (three) times daily. (Patient not taking: Reported on 06/16/2016)  . diclofenac sodium (VOLTAREN) 1 % GEL Apply 2-4 g topically 4 (four) times daily.  . ferrous sulfate 325 (65 FE) MG tablet Take 1 tablet (325 mg total) by mouth 3 (three) times daily after meals. (Patient not taking: Reported on 06/16/2016)  . metoprolol  tartrate (LOPRESSOR) 25 MG tablet Take 25 mg by mouth every morning.  . Multiple Vitamins-Minerals (ICAPS PO) Take 1 capsule by mouth every morning.  Marland Kitchen oxyCODONE-acetaminophen (PERCOCET/ROXICET) 5-325 MG tablet Take 1 tablet by mouth every 4 (four) hours as needed for severe pain. (Patient not taking: Reported on 06/16/2016)  . pantoprazole (PROTONIX) 40 MG tablet Take 40 mg by mouth every morning.  . saccharomyces boulardii (FLORASTOR) 250 MG capsule Take 250 mg by mouth every morning.  . vitamin C (VITAMIN C) 500 MG tablet Take 1 tablet (500 mg total) by mouth 2 (two) times daily.  Marland Kitchen zinc sulfate 220 MG capsule Take 1 capsule (220 mg total) by mouth daily.   No current facility-administered medications for this visit. (Other)      REVIEW OF SYSTEMS:    ALLERGIES   PAST MEDICAL HISTORY Past Medical History:  Diagnosis Date  . Acute duodenal ulcer with hemorrhage 01/18/2013  . Acute gastric ulcers with hemorrhage 01/18/2013  . Anemia   . Aortic valve replaced   . Arthritis    left hip  . Asthma    hx of in childhood   . Colon polyps    type not known, year(s) of occurrence not defined.   Marland Kitchen GERD (gastroesophageal reflux disease)   . Heart murmur   . History of transfusion   .  Hypertension   . Nocturia   . Pneumonia    Past Surgical History:  Procedure Laterality Date  . CARDIAC CATHETERIZATION    . CATARACT EXTRACTION Bilateral   . DILATION AND CURETTAGE OF UTERUS     x 2.  . ESOPHAGOGASTRODUODENOSCOPY N/A 01/18/2013   Procedure: ESOPHAGOGASTRODUODENOSCOPY (EGD);  Surgeon: Iva Booparl E Gessner, MD;  [facility];  Service: Endoscopy;  Laterality: N/A;  . INCISION AND DRAINAGE HIP Left 01/09/2014   Procedure: IRRIGATION AND DEBRIDEMENT LEFT HIP;  Surgeon: Kathryne Hitchhristopher Y Blackman, MD;  Location: [facility] OR;  Service: Orthopedics;  Laterality: Left;  . TISSUE AORTIC VALVE REPLACEMENT  Nov 18 2012   at [facility]  . TOTAL HIP ARTHROPLASTY Left 12/23/2013   Procedure: TOTAL  HIP ARTHROPLASTY ANTERIOR APPROACH, LEFT;  Surgeon: Kathryne Hitchhristopher Y Blackman, MD;  Location: [facility] ORS;  Service: Orthopedics;  Laterality: Left;  . TOTAL HIP ARTHROPLASTY Right 08/18/2014   Procedure: RIGHT TOTAL HIP ARTHROPLASTY ANTERIOR APPROACH;  Surgeon: Kathryne Hitchhristopher Y Blackman, MD;  Location: [facility] ORS;  Service: Orthopedics;  Laterality: Right;    FAMILY HISTORY Family History  Problem Relation Age of Onset  . Colon cancer Neg Hx   . Breast cancer Neg Hx   . Throat cancer Neg Hx   . Stomach cancer Neg Hx     SOCIAL HISTORY Social History   Tobacco Use  . Smoking status: Never Smoker  . Smokeless tobacco: Never Used  Substance Use Topics  . Alcohol use: No  . Drug use: No         OPHTHALMIC EXAM:  Base Eye Exam    Visual Acuity (ETDRS)      Right Left   Dist [place] CF @ face 20/200   Dist ph [place] NI 20/80 -1  Pt forgot glasses today       Tonometry (Tonopen, 1:13 PM)      Right Left   Pressure 11 13       Pupils      Pupils Dark Light Shape React APD   Right PERRL 3 2 Round Brisk None   Left PERRL 3 2 Round Brisk None       Visual Fields (Counting fingers)      Left Right    Full Full       Extraocular Movement      Right Left    Full Full       Neuro/Psych    Oriented x3: Yes   Mood/Affect: Normal       Dilation    Both eyes: 1.0% Mydriacyl, 2.5% Phenylephrine @ 1:16 PM        Slit Lamp and Fundus Exam    External Exam      Right Left   External Normal Normal       Slit Lamp Exam      Right Left   Lids/Lashes Normal Normal   Conjunctiva/Sclera White and quiet White and quiet   Cornea Clear Clear   Anterior Chamber Deep and quiet Deep and quiet   Iris Round and reactive Round and reactive   Lens Posterior chamber intraocular lens, Open posterior capsule Posterior chamber intraocular lens, Open posterior capsule   Anterior Vitreous Normal Normal       Fundus Exam      Right Left   Posterior Vitreous Posterior vitreous detachment Posterior  vitreous detachment   Disc Peripapillary atrophy Peripapillary atrophy   C/D Ratio 0.3 0.3   Macula Cystoid macular edema, Disciform scar, Advanced age related macular  degeneration, Retinal pigment epithelial atrophy, Geographic atrophy, Subretinal fibrosis Atrophy, Macular atrophy, Retinal pigment epithelial atrophy, , no macular thickening, no hemorrhage, Soft drusen   Vessels Normal Normal   Periphery Normal Normal          IMAGING AND PROCEDURES  Imaging and Procedures for 04/03/20  OCT, Retina - OU - Both Eyes       Right Eye Quality was good. Scan locations included subfoveal. Central Foveal Thickness: 1073. Progression has been stable. Findings include abnormal foveal contour, cystoid macular edema.   Left Eye Quality was good. Scan locations included subfoveal. Central Foveal Thickness: 219. Progression has been stable. Findings include abnormal foveal contour, central retinal atrophy, outer retinal atrophy, subretinal hyper-reflective material.   Notes Improved macular anatomy OS overall, outer retinal atrophy and hyper reflective material in the fovea and the photoreceptor layer account for acuity.  At 83-month interval CNVM control.       Color Fundus Photography Optos - OU - Both Eyes       Right Eye Progression has improved. Macula : exudates, hemorrhage, edema, geographic atrophy. Vessels : normal observations. Periphery : normal observations.   Left Eye Progression has improved. Disc findings include normal observations. Macula : normal observations, geographic atrophy. Vessels : normal observations. Periphery : normal observations.   Notes OD, large disciform macular scar, no change, left eye with geographic atrophy paracentral         Intravitreal Injection, Pharmacologic Agent - OS - Left Eye       Time Out 04/03/2020. 1:56 PM. Confirmed correct patient, procedure, site, and patient consented.   Anesthesia Topical anesthesia was used. Anesthetic  medications included Akten 3.5%.   Procedure Preparation included Tobramycin 0.3%, Ofloxacin , 10% betadine to eyelids, 5% betadine to ocular surface. A 30 gauge needle was used.   Injection:  2.5 mg Bevacizumab (AVASTIN) 2.5mg /0.71mL SOSY   NDC: 35670-141-03   Route: Intravitreal, Site: Left Eye  Post-op Post injection exam found visual acuity of at least counting fingers. The patient tolerated the procedure well. There were no complications. The patient received written and verbal post procedure care education. Post injection medications were not given.                 ASSESSMENT/PLAN:  Exudative age-related macular degeneration of left eye with active choroidal neovascularization (HCC) The nature of wet macular degeneration was discussed with the patient.  Forms of therapy reviewed include the use of Anti-VEGF medications injected painlessly into the eye, as well as other possible treatment modalities, including thermal laser therapy. Fellow eye involvement and risks were discussed with the patient. 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 The treatment regimen is on a treat as needed basis with the intent to treat if necessary and extend interval of exams when possible. On average 1 out of 6 patients do not need lifetime therapy. However, the risk of recurrent disease is high for a lifetime.  Initially monthly, then periodic, examinations and evaluations will determine whether the next treatment is required on the day of the examination.  OS stable, at 5-month interval.  We will repeat injection today and examine again in 4 months  Exudative age-related macular degeneration of right eye with inactive choroidal neovascularization (HCC) No active disease no extension no enlargement of lesion centrally OD not treatable      ICD-10-CM   1. Exudative age-related macular degeneration of left eye with active choroidal neovascularization (HCC)   H35.3221 OCT,  Retina - OU - Both Eyes    Color Fundus Photography Optos - OU - Both Eyes    Intravitreal Injection, Pharmacologic Agent - OS - Left Eye    bevacizumab (AVASTIN) SOSY 2.5 mg  2. Exudative age-related macular degeneration of right eye with inactive choroidal neovascularization (HCC)  H35.3212     1.  OS, central geographic atrophy limits acuity, edge lesion of CNVM now well controlled at 45-month follow-up on Avastin.  We will repeat injection today and will consider reevaluation each eye in 4 months  2.  3.  Ophthalmic Meds Ordered this visit:  Meds ordered this encounter  Medications  . bevacizumab (AVASTIN) SOSY 2.5 mg       Return in about 4 months (around 08/01/2020) for DILATE OU, AVASTIN OCT, OS.  There are no Patient Instructions on file for this visit.   Explained the diagnoses, plan, and follow up with the patient and they expressed understanding.  Patient expressed understanding of the importance of proper follow up care.   Alford Highland Yisell Sprunger M.D. Diseases & Surgery of the Retina and Vitreous [facility] 04/03/20     Abbreviations: M myopia (nearsighted); A astigmatism; H hyperopia (farsighted); P presbyopia; Mrx spectacle prescription;  CTL contact lenses; OD right eye; OS left eye; OU both eyes  XT exotropia; ET esotropia; PEK punctate epithelial keratitis; PEE punctate epithelial erosions; DES dry eye syndrome; MGD meibomian gland dysfunction; ATs artificial tears; PFAT's preservative free artificial tears; NSC nuclear sclerotic cataract; PSC posterior subcapsular cataract; ERM epi-retinal membrane; PVD posterior vitreous detachment; RD retinal detachment; DM diabetes mellitus; DR diabetic retinopathy; NPDR non-proliferative diabetic retinopathy; PDR proliferative diabetic retinopathy; CSME clinically significant macular edema; DME diabetic macular edema; dbh dot blot hemorrhages; CWS cotton wool spot; POAG primary open angle glaucoma; C/D  cup-to-disc ratio; HVF humphrey visual field; GVF goldmann visual field; OCT optical coherence tomography; IOP intraocular pressure; BRVO Branch retinal vein occlusion; CRVO central retinal vein occlusion; CRAO central retinal artery occlusion; BRAO branch retinal artery occlusion; RT retinal tear; SB scleral buckle; PPV pars plana vitrectomy; VH Vitreous hemorrhage; PRP panretinal laser photocoagulation; IVK intravitreal kenalog; VMT vitreomacular traction; MH Macular hole;  NVD neovascularization of the disc; NVE neovascularization elsewhere; AREDS age related eye disease study; ARMD age related macular degeneration; POAG primary open angle glaucoma; EBMD epithelial/anterior basement membrane dystrophy; ACIOL anterior chamber intraocular lens; IOL intraocular lens; PCIOL posterior chamber intraocular lens; Phaco/IOL phacoemulsification with intraocular lens placement; PRK photorefractive keratectomy; LASIK laser assisted in situ keratomileusis; HTN hypertension; DM diabetes mellitus; COPD chronic obstructive pulmonary disease

## 2020-07-31 ENCOUNTER — Encounter (INDEPENDENT_AMBULATORY_CARE_PROVIDER_SITE_OTHER): Payer: Self-pay | Admitting: Ophthalmology

## 2020-07-31 ENCOUNTER — Other Ambulatory Visit: Payer: Self-pay

## 2020-07-31 ENCOUNTER — Ambulatory Visit (INDEPENDENT_AMBULATORY_CARE_PROVIDER_SITE_OTHER): Payer: Medicare Other | Admitting: Ophthalmology

## 2020-07-31 DIAGNOSIS — H43822 Vitreomacular adhesion, left eye: Secondary | ICD-10-CM

## 2020-07-31 DIAGNOSIS — H353221 Exudative age-related macular degeneration, left eye, with active choroidal neovascularization: Secondary | ICD-10-CM

## 2020-07-31 DIAGNOSIS — H353212 Exudative age-related macular degeneration, right eye, with inactive choroidal neovascularization: Secondary | ICD-10-CM | POA: Diagnosis not present

## 2020-07-31 MED ORDER — BEVACIZUMAB 2.5 MG/0.1ML IZ SOSY
2.5000 mg | PREFILLED_SYRINGE | INTRAVITREAL | Status: AC | PRN
  Administered 2020-07-31: 2.5 mg via INTRAVITREAL

## 2020-07-31 NOTE — Assessment & Plan Note (Signed)
Minor condition OS no impact on acuity nor distortions

## 2020-07-31 NOTE — Assessment & Plan Note (Signed)
Stabilize macular condition left eye wet AMD with history of recurrence, and now controlled with maintenance dose at 2 4 months interval

## 2020-07-31 NOTE — Progress Notes (Signed)
07/31/2020     CHIEF COMPLAINT Patient presents for Retina Follow Up (4 Mo F/U OS, poss Avastin OS//Pt denies noticeable changes to [place] OU since last visit. Pt denies ocular pain or floaters OU. Pt reports intermittent flashes, but is not "quite sure which eye" possibly OD.//)   HISTORY OF PRESENT ILLNESS: [name] is a 87 y.o. female who presents to the clinic today for:   HPI    Retina Follow Up    Diagnosis: Wet AMD   Laterality: left eye   Onset: 4 months ago   Severity: moderate   Duration: 4 months   Course: stable   Comments: 4 Mo F/U OS, poss Avastin OS  Pt denies noticeable changes to [place] OU since last visit. Pt denies ocular pain or floaters OU. Pt reports intermittent flashes, but is not "quite sure which eye" possibly OD.            Comments    No significant change in vision still reads comfortably       Last edited by Edmon Crape, MD on 07/31/2020  2:21 PM. (History)      Referring [place]: [place] [address] [place],  [place] [phone]  HISTORICAL INFORMATION:   Selected notes from the MEDICAL RECORD NUMBER       CURRENT MEDICATIONS: Current Outpatient Medications (Ophthalmic Drugs)  Medication Sig  . Polyvinyl Alcohol-Povidone (REFRESH OP) Apply 1-2 drops to eye daily as needed (dry eyes.).   No current facility-administered medications for this visit. (Ophthalmic Drugs)   Current Outpatient Medications (Other)  Medication Sig  . acetaminophen (TYLENOL) 325 MG tablet Take by mouth.  Marland Kitchen alendronate (FOSAMAX) 70 MG tablet Take 70 mg by mouth once a week. Take with a full glass of water on an empty stomach.  Marland Kitchen aspirin 81 MG tablet Take 81 mg by mouth daily.  . Cholecalciferol (VITAMIN D3) 2000 UNITS capsule Take 2,000 Units by mouth every morning.  . clindamycin (CLEOCIN) 300 MG capsule Take 1 capsule (300 mg total) by mouth 3 (three) times daily. (Patient not taking: Reported on 06/16/2016)  . diclofenac sodium (VOLTAREN) 1 % GEL  Apply 2-4 g topically 4 (four) times daily.  . ferrous sulfate 325 (65 FE) MG tablet Take 1 tablet (325 mg total) by mouth 3 (three) times daily after meals. (Patient not taking: Reported on 06/16/2016)  . metoprolol tartrate (LOPRESSOR) 25 MG tablet Take 25 mg by mouth every morning.  . Multiple Vitamins-Minerals (ICAPS PO) Take 1 capsule by mouth every morning.  Marland Kitchen oxyCODONE-acetaminophen (PERCOCET/ROXICET) 5-325 MG tablet Take 1 tablet by mouth every 4 (four) hours as needed for severe pain. (Patient not taking: Reported on 06/16/2016)  . pantoprazole (PROTONIX) 40 MG tablet Take 40 mg by mouth every morning.  . saccharomyces boulardii (FLORASTOR) 250 MG capsule Take 250 mg by mouth every morning.  . vitamin C (VITAMIN C) 500 MG tablet Take 1 tablet (500 mg total) by mouth 2 (two) times daily.  Marland Kitchen zinc sulfate 220 MG capsule Take 1 capsule (220 mg total) by mouth daily.   No current facility-administered medications for this visit. (Other)      REVIEW OF SYSTEMS:    ALLERGIES Allergies  Allergen Reactions  . Sulfa Antibiotics   . Tramadol Nausea Only  . Penicillins Rash    Has patient had a PCN reaction causing immediate rash,     PAST MEDICAL HISTORY Past Medical History:  Diagnosis Date  . Acute duodenal  ulcer with hemorrhage 01/18/2013  . Acute gastric ulcers with hemorrhage 01/18/2013  . Anemia   . Aortic valve replaced   . Arthritis    left hip  . Asthma    hx of in childhood   . Colon polyps    type not known, year(s) of occurrence not defined.   Marland Kitchen. GERD (gastroesophageal reflux disease)   . Heart murmur   . History of transfusion   . Hypertension   . Nocturia   . Pneumonia    Past Surgical History:  Procedure Laterality Date  . CARDIAC CATHETERIZATION    . CATARACT EXTRACTION Bilateral   . DILATION AND CURETTAGE OF UTERUS     x 2.  . ESOPHAGOGASTRODUODENOSCOPY N/A 01/18/2013   Procedure: ESOPHAGOGASTRODUODENOSCOPY (EGD);  Surgeon: Iva Booparl E Gessner, MD;   [facility];  Service: Endoscopy;  Laterality: N/A;  . INCISION AND DRAINAGE HIP Left 01/09/2014   Procedure: IRRIGATION AND DEBRIDEMENT LEFT HIP;  Surgeon: Kathryne Hitchhristopher Y Blackman, MD;  Location: [facility] OR;  Service: Orthopedics;  Laterality: Left;  . TISSUE AORTIC VALVE REPLACEMENT  Nov 18 2012   at [facility]  . TOTAL HIP ARTHROPLASTY Left 12/23/2013   Procedure: TOTAL HIP ARTHROPLASTY ANTERIOR APPROACH, LEFT;  Surgeon: Kathryne Hitchhristopher Y Blackman, MD;  Location: [facility] ORS;  Service: Orthopedics;  Laterality: Left;  . TOTAL HIP ARTHROPLASTY Right 08/18/2014   Procedure: RIGHT TOTAL HIP ARTHROPLASTY ANTERIOR APPROACH;  Surgeon: Kathryne Hitchhristopher Y Blackman, MD;  Location: [facility];  Service: Orthopedics;  Laterality: Right;    FAMILY HISTORY Family History  Problem Relation Age of Onset  . Colon cancer Neg Hx   . Breast cancer Neg Hx   . Throat cancer Neg Hx   . Stomach cancer Neg Hx     SOCIAL HISTORY Social History   Tobacco Use  . Smoking status: Never Smoker  . Smokeless tobacco: Never Used  Substance Use Topics  . Alcohol use: No  . Drug use: No         OPHTHALMIC EXAM:  Base Eye Exam    Visual Acuity (ETDRS)      Right Left   Dist cc HM 20/40   Dist ph cc NI NI   Correction: Glasses       Tonometry (Tonopen, 1:26 PM)      Right Left   Pressure 09 09       Pupils      Pupils Dark Light Shape React APD   Right PERRL 3 2 Round Brisk None   Left PERRL 3 2 Round Brisk None       Visual Fields (Counting fingers)      Left Right    Full    Restrictions  Total superior temporal deficiency       Extraocular Movement      Right Left    Full Full       Neuro/Psych    Oriented x3: Yes   Mood/Affect: Normal       Dilation    Left eye: 1.0% Mydriacyl, 2.5% Phenylephrine @ 1:26 PM        Slit Lamp and Fundus Exam    External Exam      Right Left   External Normal Normal       Slit Lamp Exam      Right Left   Lids/Lashes Normal Normal    Conjunctiva/Sclera White and quiet White and quiet   Cornea Clear Clear   Anterior Chamber Deep and quiet Deep and quiet  Iris Round and reactive Round and reactive   Lens Posterior chamber intraocular lens, Open posterior capsule Posterior chamber intraocular lens, Open posterior capsule   Anterior Vitreous Normal Normal       Fundus Exam      Right Left   Posterior Vitreous  Posterior vitreous detachment   Disc  Peripapillary atrophy   C/D Ratio  0.3   Macula  Atrophy, Macular atrophy, Retinal pigment epithelial atrophy, , no macular thickening, no hemorrhage, Soft drusen   Vessels  Normal   Periphery  Normal          IMAGING AND PROCEDURES  Imaging and Procedures for 07/31/20  OCT, Retina - OU - Both Eyes       Right Eye Quality was good. Scan locations included subfoveal. Central Foveal Thickness: 1083. Progression has been stable. Findings include abnormal foveal contour, cystoid macular edema.   Left Eye Quality was good. Scan locations included subfoveal. Central Foveal Thickness: 224. Progression has been stable. Findings include abnormal foveal contour, central retinal atrophy, outer retinal atrophy, subretinal hyper-reflective material, vitreomacular adhesion .   Notes Improved macular anatomy OS overall, outer retinal atrophy and hyper reflective material in the fovea and the photoreceptor layer account for acuity.  At 38-month interval CNVM control.       Intravitreal Injection, Pharmacologic Agent - OS - Left Eye       Time Out 07/31/2020. 2:22 PM. Confirmed correct patient, procedure, site, and patient consented.   Anesthesia Topical anesthesia was used. Anesthetic medications included Akten 3.5%.   Procedure Preparation included Tobramycin 0.3%, Ofloxacin , 10% betadine to eyelids, 5% betadine to ocular surface. A 30 gauge needle was used.   Injection:  2.5 mg Bevacizumab (AVASTIN) 2.5mg /0.54mL SOSY   NDC: 27253-664-40, Lot: [id number]   Route:  Intravitreal, Site: Left Eye  Post-op Post injection exam found visual acuity of at least counting fingers. The patient tolerated the procedure well. There were no complications. The patient received written and verbal post procedure care education. Post injection medications were not given.                 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 Exudative age-related macular degeneration of left eye with active choroidal neovascularization (HCC)  H35.3221 OCT, Retina - OU - Both Eyes    Intravitreal Injection, Pharmacologic Agent - OS - Left Eye    bevacizumab (AVASTIN) SOSY 2.5 mg  2. Exudative age-related macular degeneration of right eye with inactive choroidal neovascularization (HCC)  H35.3212   3. Vitreomacular adhesion, left  H43.822     1.  Chronic disciform macular scar right eye by OCT no interval change, atrophic retina not treatable  2.  Left eye, chronic recurrence of CNVM now controlled on maintenance therapy and previously on quarterly basis evaluation examination and treatment  Now at 63-month follow-up.  We will retreat today to maintain and prevent recurrence  3.  Ophthalmic Meds Ordered this visit:  Meds ordered this encounter  Medications  . bevacizumab (AVASTIN) SOSY 2.5 mg  Return in about 4 months (around 12/01/2020) for DILATE OU, AVASTIN OCT, OS.  There are no Patient Instructions on file for this visit.   Explained the diagnoses, plan,  and follow up with the patient and they expressed understanding.  Patient expressed understanding of the importance of proper follow up care.   Alford Highland Johnmichael Melhorn M.D. Diseases & Surgery of the Retina and Vitreous [facility] 07/31/20     Abbreviations: M myopia (nearsighted); A astigmatism; H hyperopia (farsighted); P presbyopia; Mrx spectacle prescription;  CTL contact lenses; OD right eye; OS left eye; OU both eyes  XT exotropia; ET esotropia; PEK punctate epithelial keratitis; PEE punctate epithelial erosions; DES dry eye syndrome; MGD meibomian gland dysfunction; ATs artificial tears; PFAT's preservative free artificial tears; NSC nuclear sclerotic cataract; PSC posterior subcapsular cataract; ERM epi-retinal membrane; PVD posterior vitreous detachment; RD retinal detachment; DM diabetes mellitus; DR diabetic retinopathy; NPDR non-proliferative diabetic retinopathy; PDR proliferative diabetic retinopathy; CSME clinically significant macular edema; DME diabetic macular edema; dbh dot blot hemorrhages; CWS cotton wool spot; POAG primary open angle glaucoma; C/D cup-to-disc ratio; HVF humphrey visual field; GVF goldmann visual field; OCT optical coherence tomography; IOP intraocular pressure; BRVO Branch retinal vein occlusion; CRVO central retinal vein occlusion; CRAO central retinal artery occlusion; BRAO branch retinal artery occlusion; RT retinal tear; SB scleral buckle; PPV pars plana vitrectomy; VH Vitreous hemorrhage; PRP panretinal laser photocoagulation; IVK intravitreal kenalog; VMT vitreomacular traction; MH Macular hole;  NVD neovascularization of the disc; NVE neovascularization elsewhere; AREDS age related eye disease study; ARMD age related macular degeneration; POAG primary open angle glaucoma; EBMD epithelial/anterior basement membrane dystrophy; ACIOL anterior chamber intraocular lens; IOL intraocular lens; PCIOL posterior chamber intraocular lens; Phaco/IOL  phacoemulsification with intraocular lens placement; PRK photorefractive keratectomy; LASIK laser assisted in situ keratomileusis; HTN hypertension; DM diabetes mellitus; COPD chronic obstructive pulmonary disease

## 2020-07-31 NOTE — Assessment & Plan Note (Signed)
Large disciform submacular scar with atrophic retina, not treatable, right eye

## 2020-12-04 ENCOUNTER — Encounter (INDEPENDENT_AMBULATORY_CARE_PROVIDER_SITE_OTHER): Payer: Medicare Other | Admitting: Ophthalmology

## 2020-12-05 ENCOUNTER — Ambulatory Visit (INDEPENDENT_AMBULATORY_CARE_PROVIDER_SITE_OTHER): Payer: Medicare Other | Admitting: Ophthalmology

## 2020-12-05 ENCOUNTER — Other Ambulatory Visit: Payer: Self-pay

## 2020-12-05 ENCOUNTER — Encounter (INDEPENDENT_AMBULATORY_CARE_PROVIDER_SITE_OTHER): Payer: Self-pay | Admitting: Ophthalmology

## 2020-12-05 DIAGNOSIS — H353123 Nonexudative age-related macular degeneration, left eye, advanced atrophic without subfoveal involvement: Secondary | ICD-10-CM | POA: Diagnosis not present

## 2020-12-05 DIAGNOSIS — H353124 Nonexudative age-related macular degeneration, left eye, advanced atrophic with subfoveal involvement: Secondary | ICD-10-CM | POA: Insufficient documentation

## 2020-12-05 DIAGNOSIS — H353114 Nonexudative age-related macular degeneration, right eye, advanced atrophic with subfoveal involvement: Secondary | ICD-10-CM | POA: Diagnosis not present

## 2020-12-05 DIAGNOSIS — H353221 Exudative age-related macular degeneration, left eye, with active choroidal neovascularization: Secondary | ICD-10-CM | POA: Diagnosis not present

## 2020-12-05 DIAGNOSIS — H353212 Exudative age-related macular degeneration, right eye, with inactive choroidal neovascularization: Secondary | ICD-10-CM | POA: Diagnosis not present

## 2020-12-05 MED ORDER — BEVACIZUMAB 2.5 MG/0.1ML IZ SOSY
2.5000 mg | PREFILLED_SYRINGE | INTRAVITREAL | Status: AC | PRN
  Administered 2020-12-05: 2.5 mg via INTRAVITREAL

## 2020-12-05 NOTE — Assessment & Plan Note (Signed)
OD, no active disease that is treatable.  Large atrophic macular elevation

## 2020-12-05 NOTE — Assessment & Plan Note (Signed)
Chronic active CNVM leading to atrophy yet now at 4 months remained stable with no signs of recurrence.  Repeat injection today for maintenance yet extend interval examination next to 5 months with no planned injection

## 2020-12-05 NOTE — Assessment & Plan Note (Signed)
Atrophic changes in the macula encroaching upon FAZ

## 2020-12-05 NOTE — Progress Notes (Signed)
12/05/2020     CHIEF COMPLAINT Patient presents for  Chief Complaint  Patient presents with   Retina Follow Up      HISTORY OF PRESENT ILLNESS: [name] is a 87 y.o. female who presents to the clinic today for:   HPI     Retina Follow Up   Patient presents with  Wet AMD.  In left eye.  This started 4 months ago.  Severity is mild.  Duration of 4 months.  Since onset it is stable.        Comments   4 month fu ou and avastin os and OCT Pt states VA OU stable since last visit. Pt denies FOL, floaters, or ocular pain OU.  Pt states, "I have seen some clear floaters but they have kind of went away now."       Last edited by Demetrios Loll, COA on 12/05/2020 10:34 AM.      Referring [place]: [place] [address] [place],  [place] [phone]  HISTORICAL INFORMATION:   Selected notes from the MEDICAL RECORD NUMBER       CURRENT MEDICATIONS: Current Outpatient Medications (Ophthalmic Drugs)  Medication Sig   Polyvinyl Alcohol-Povidone (REFRESH OP) Apply 1-2 drops to eye daily as needed (dry eyes.).   No current facility-administered medications for this visit. (Ophthalmic Drugs)   Current Outpatient Medications (Other)  Medication Sig   acetaminophen (TYLENOL) 325 MG tablet Take by mouth.   alendronate (FOSAMAX) 70 MG tablet Take 70 mg by mouth once a week. Take with a full glass of water on an empty stomach.   aspirin 81 MG tablet Take 81 mg by mouth daily.   Cholecalciferol (VITAMIN D3) 2000 UNITS capsule Take 2,000 Units by mouth every morning.   clindamycin (CLEOCIN) 300 MG capsule Take 1 capsule (300 mg total) by mouth 3 (three) times daily. (Patient not taking: Reported on 06/16/2016)   diclofenac sodium (VOLTAREN) 1 % GEL Apply 2-4 g topically 4 (four) times daily.   ferrous sulfate 325 (65 FE) MG tablet Take 1 tablet (325 mg total) by mouth 3 (three) times daily after meals. (Patient not taking: Reported on 06/16/2016)   metoprolol tartrate  (LOPRESSOR) 25 MG tablet Take 25 mg by mouth every morning.   Multiple Vitamins-Minerals (ICAPS PO) Take 1 capsule by mouth every morning.   oxyCODONE-acetaminophen (PERCOCET/ROXICET) 5-325 MG tablet Take 1 tablet by mouth every 4 (four) hours as needed for severe pain. (Patient not taking: Reported on 06/16/2016)   pantoprazole (PROTONIX) 40 MG tablet Take 40 mg by mouth every morning.   saccharomyces boulardii (FLORASTOR) 250 MG capsule Take 250 mg by mouth every morning.   vitamin C (VITAMIN C) 500 MG tablet Take 1 tablet (500 mg total) by mouth 2 (two) times daily.   zinc sulfate 220 MG capsule Take 1 capsule (220 mg total) by mouth daily.   No current facility-administered medications for this visit. (Other)      REVIEW OF SYSTEMS:    ALLERGIES Allergies  Allergen Reactions   Sulfa Antibiotics    Tramadol Nausea Only   Penicillins Rash    Has patient had a PCN reaction causing immediate rash,     PAST MEDICAL HISTORY Past Medical History:  Diagnosis Date   Acute duodenal ulcer with hemorrhage 01/18/2013   Acute gastric ulcers with hemorrhage 01/18/2013   Anemia    Aortic valve replaced    Arthritis    left hip   Asthma  hx of in childhood    Colon polyps    type not known, year(s) of occurrence not defined.    GERD (gastroesophageal reflux disease)    Heart murmur    History of transfusion    Hypertension    Nocturia    Pneumonia    Past Surgical History:  Procedure Laterality Date   CARDIAC CATHETERIZATION     CATARACT EXTRACTION Bilateral    DILATION AND CURETTAGE OF UTERUS     x 2.   ESOPHAGOGASTRODUODENOSCOPY N/A 01/18/2013   Procedure: ESOPHAGOGASTRODUODENOSCOPY (EGD);  Surgeon: Iva Boop, MD;  Location: [facility] ENDOSCOPY;  Service: Endoscopy;  Laterality: N/A;   INCISION AND DRAINAGE HIP Left 01/09/2014   Procedure: IRRIGATION AND DEBRIDEMENT LEFT HIP;  Surgeon: Kathryne Hitch, MD;  Location: [facility] OR;  Service: Orthopedics;  Laterality: Left;    TISSUE AORTIC VALVE REPLACEMENT  Nov 18 2012   at [facility]   TOTAL HIP ARTHROPLASTY Left 12/23/2013   Procedure: TOTAL HIP ARTHROPLASTY ANTERIOR APPROACH, LEFT;  Surgeon: Kathryne Hitch, MD;  Location: [facility] ORS;  Service: Orthopedics;  Laterality: Left;   TOTAL HIP ARTHROPLASTY Right 08/18/2014   Procedure: RIGHT TOTAL HIP ARTHROPLASTY ANTERIOR APPROACH;  Surgeon: Kathryne Hitch, MD;  Location: [facility] ORS;  Service: Orthopedics;  Laterality: Right;    FAMILY HISTORY Family History  Problem Relation Age of Onset   Colon cancer Neg Hx    Breast cancer Neg Hx    Throat cancer Neg Hx    Stomach cancer Neg Hx     SOCIAL HISTORY Social History   Tobacco Use   Smoking status: Never   Smokeless tobacco: Never  Substance Use Topics   Alcohol use: No   Drug use: No         OPHTHALMIC EXAM:  Base Eye Exam     Visual Acuity (ETDRS)       Right Left   Dist cc HM 20/80 -2   Dist ph cc  NI         Tonometry (Tonopen, 10:36 AM)       Right Left   Pressure 14 13         Pupils       Pupils Dark Light Shape React APD   Right PERRL 3 2 Round Brisk None   Left PERRL 3 2 Round Brisk None         Visual Fields       Left Right    Full    Restrictions  Total superior temporal deficiency         Extraocular Movement       Right Left    Full Full         Neuro/Psych     Oriented x3: Yes   Mood/Affect: Normal         Dilation     Left eye: 1.0% Mydriacyl, 2.5% Phenylephrine @ 10:36 AM           Slit Lamp and Fundus Exam     External Exam       Right Left   External Normal Normal         Slit Lamp Exam       Right Left   Lids/Lashes Normal Normal   Conjunctiva/Sclera White and quiet White and quiet   Cornea Clear Clear   Anterior Chamber Deep and quiet Deep and quiet   Iris Round and reactive Round and reactive   Lens Posterior chamber intraocular lens, Open  posterior capsule Posterior chamber intraocular lens, Open  posterior capsule   Anterior Vitreous Normal Normal         Fundus Exam       Right Left   Posterior Vitreous Posterior vitreous detachment Posterior vitreous detachment   Disc Normal Peripapillary atrophy   C/D Ratio 0.3 0.3   Macula Large subfoveal disciform scar, Disciform scar with chronic fluid overlying, Macular thickening, Cystoid macular edema Atrophy, Macular atrophy, Retinal pigment epithelial atrophy, , no macular thickening, no hemorrhage, Soft drusen   Vessels Normal Normal   Periphery Normal Normal            IMAGING AND PROCEDURES  Imaging and Procedures for 12/05/20  Intravitreal Injection, Pharmacologic Agent - OS - Left Eye       Time Out 12/05/2020. 11:22 AM. Confirmed correct patient, procedure, site, and patient consented.   Anesthesia Topical anesthesia was used. Anesthetic medications included Akten 3.5%.   Procedure Preparation included Tobramycin 0.3%, Ofloxacin , 10% betadine to eyelids, 5% betadine to ocular surface. A 30 gauge needle was used.   Injection: 2.5 mg bevacizumab 2.5 MG/0.1ML   Route: Intravitreal, Site: Left Eye   NDC: [phone], Lot: [id number]   Post-op Post injection exam found visual acuity of at least counting fingers. The patient tolerated the procedure well. There were no complications. The patient received written and verbal post procedure care education. Post injection medications were not given.      OCT, Retina - OU - Both Eyes       Right Eye Quality was good. Scan locations included subfoveal. Central Foveal Thickness: 1083. Progression has been stable. Findings include abnormal foveal contour, cystoid macular edema.   Left Eye Quality was good. Scan locations included subfoveal. Central Foveal Thickness: 228. Progression has been stable. Findings include abnormal foveal contour, central retinal atrophy, outer retinal atrophy, subretinal hyper-reflective material, vitreomacular adhesion .   Notes Improved  macular anatomy OS overall, outer retinal atrophy and hyper reflective material in the fovea and the photoreceptor layer account for acuity.  At 70-month interval CNVM control.  OD not treatable lesion with chronic atrophy in the macula.  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  Large atrophic macular elevation  Advanced nonexudative age-related macular degeneration of left eye without subfoveal involvement Atrophic changes in the macula encroaching upon FAZ  Advanced nonexudative age-related macular degeneration of right eye with subfoveal involvement Subfoveal OD accounts for acuity  Exudative age-related macular degeneration of left eye with active choroidal neovascularization (HCC) Chronic active CNVM leading to atrophy yet now at 4 months remained stable with no signs of recurrence.  Repeat injection today for maintenance yet extend interval examination next to 5 months with no planned injection     ICD-10-CM   1. Exudative age-related macular degeneration of left eye with active choroidal neovascularization (HCC)  H35.3221 Intravitreal Injection, Pharmacologic Agent - OS - Left Eye    OCT, Retina - OU - Both Eyes    bevacizumab (AVASTIN) SOSY 2.5 mg    2. Exudative age-related macular degeneration of right eye with inactive choroidal neovascularization (HCC)  H35.3212     3. Advanced nonexudative age-related macular degeneration of left eye without subfoveal involvement  H35.3123     4. Advanced nonexudative age-related macular degeneration of right eye with subfoveal involvement  H35.3114  1.  2.  3.  Ophthalmic Meds Ordered this visit:  Meds ordered this encounter  Medications   bevacizumab (AVASTIN) SOSY 2.5 mg       Return in about 5 months (around 05/07/2021) for DILATE OU, COLOR FP,  OCT,, no planned injection OS.  There are no Patient Instructions on file for this visit.   Explained the diagnoses, plan, and follow up with the patient and they expressed understanding.  Patient expressed understanding of the importance of proper follow up care.   Alford Highland Antawan Mchugh M.D. Diseases & Surgery of the Retina and Vitreous [facility] 12/05/20     Abbreviations: M myopia (nearsighted); A astigmatism; H hyperopia (farsighted); P presbyopia; Mrx spectacle prescription;  CTL contact lenses; OD right eye; OS left eye; OU both eyes  XT exotropia; ET esotropia; PEK punctate epithelial keratitis; PEE punctate epithelial erosions; DES dry eye syndrome; MGD meibomian gland dysfunction; ATs artificial tears; PFAT's preservative free artificial tears; NSC nuclear sclerotic cataract; PSC posterior subcapsular cataract; ERM epi-retinal membrane; PVD posterior vitreous detachment; RD retinal detachment; DM diabetes mellitus; DR diabetic retinopathy; NPDR non-proliferative diabetic retinopathy; PDR proliferative diabetic retinopathy; CSME clinically significant macular edema; DME diabetic macular edema; dbh dot blot hemorrhages; CWS cotton wool spot; POAG primary open angle glaucoma; C/D cup-to-disc ratio; HVF humphrey visual field; GVF goldmann visual field; OCT optical coherence tomography; IOP intraocular pressure; BRVO Branch retinal vein occlusion; CRVO central retinal vein occlusion; CRAO central retinal artery occlusion; BRAO branch retinal artery occlusion; RT retinal tear; SB scleral buckle; PPV pars plana vitrectomy; VH Vitreous hemorrhage; PRP panretinal laser photocoagulation; IVK intravitreal kenalog; VMT vitreomacular traction; MH Macular hole;  NVD neovascularization of the disc; NVE neovascularization elsewhere; AREDS age related eye disease study; ARMD age related macular degeneration; POAG primary open angle glaucoma; EBMD epithelial/anterior basement membrane dystrophy;  ACIOL anterior chamber intraocular lens; IOL intraocular lens; PCIOL posterior chamber intraocular lens; Phaco/IOL phacoemulsification with intraocular lens placement; PRK photorefractive keratectomy; LASIK laser assisted in situ keratomileusis; HTN hypertension; DM diabetes mellitus; COPD chronic obstructive pulmonary disease

## 2020-12-05 NOTE — Assessment & Plan Note (Signed)
Subfoveal OD accounts for acuity

## 2021-05-08 ENCOUNTER — Encounter (INDEPENDENT_AMBULATORY_CARE_PROVIDER_SITE_OTHER): Payer: Medicare Other | Admitting: Ophthalmology

## 2021-05-15 ENCOUNTER — Encounter (INDEPENDENT_AMBULATORY_CARE_PROVIDER_SITE_OTHER): Payer: Self-pay | Admitting: Ophthalmology

## 2021-05-15 ENCOUNTER — Ambulatory Visit (INDEPENDENT_AMBULATORY_CARE_PROVIDER_SITE_OTHER): Payer: Medicare Other | Admitting: Ophthalmology

## 2021-05-15 ENCOUNTER — Other Ambulatory Visit: Payer: Self-pay

## 2021-05-15 DIAGNOSIS — H353212 Exudative age-related macular degeneration, right eye, with inactive choroidal neovascularization: Secondary | ICD-10-CM

## 2021-05-15 DIAGNOSIS — H353123 Nonexudative age-related macular degeneration, left eye, advanced atrophic without subfoveal involvement: Secondary | ICD-10-CM | POA: Diagnosis not present

## 2021-05-15 DIAGNOSIS — H353221 Exudative age-related macular degeneration, left eye, with active choroidal neovascularization: Secondary | ICD-10-CM

## 2021-05-15 NOTE — Assessment & Plan Note (Signed)
At 69-month interval today that is 3 and half months post treatment effect, no sign of CNVM, in order to prevent or delay the development of atrophy, will continue to observe follow-up in 3 months ?

## 2021-05-15 NOTE — Assessment & Plan Note (Signed)
OD with no signs of reactivation of CNVM will continue to monitor and observe ?

## 2021-05-15 NOTE — Progress Notes (Signed)
05/15/2021     CHIEF COMPLAINT Patient presents for  Chief Complaint  Patient presents with   Macular Degeneration      HISTORY OF PRESENT ILLNESS: [name] is a 88 y.o. female who presents to the clinic today for:   HPI   No interval change in vision per patient Last edited by Hurman Horn, MD on 05/15/2021 10:43 AM.      Referring [place]: [place] [address] [place],  [place] [address]  HISTORICAL INFORMATION:   Selected notes from the MEDICAL RECORD NUMBER       CURRENT MEDICATIONS: Current Outpatient Medications (Ophthalmic Drugs)  Medication Sig   Polyvinyl Alcohol-Povidone (REFRESH OP) Apply 1-2 drops to eye daily as needed (dry eyes.).   No current facility-administered medications for this visit. (Ophthalmic Drugs)   Current Outpatient Medications (Other)  Medication Sig   acetaminophen (TYLENOL) 325 MG tablet Take by mouth.   alendronate (FOSAMAX) 70 MG tablet Take 70 mg by mouth once a week. Take with a full glass of water on an empty stomach.   aspirin 81 MG tablet Take 81 mg by mouth daily.   Cholecalciferol (VITAMIN D3) 2000 UNITS capsule Take 2,000 Units by mouth every morning.   clindamycin (CLEOCIN) 300 MG capsule Take 1 capsule (300 mg total) by mouth 3 (three) times daily. (Patient not taking: Reported on 06/16/2016)   diclofenac sodium (VOLTAREN) 1 % GEL Apply 2-4 g topically 4 (four) times daily.   ferrous sulfate 325 (65 FE) MG tablet Take 1 tablet (325 mg total) by mouth 3 (three) times daily after meals. (Patient not taking: Reported on 06/16/2016)   metoprolol tartrate (LOPRESSOR) 25 MG tablet Take 25 mg by mouth every morning.   Multiple Vitamins-Minerals (ICAPS PO) Take 1 capsule by mouth every morning.   oxyCODONE-acetaminophen (PERCOCET/ROXICET) 5-325 MG tablet Take 1 tablet by mouth every 4 (four) hours as needed for severe pain. (Patient not taking: Reported on 06/16/2016)   pantoprazole (PROTONIX) 40 MG tablet Take 40 mg by  mouth every morning.   saccharomyces boulardii (FLORASTOR) 250 MG capsule Take 250 mg by mouth every morning.   vitamin C (VITAMIN C) 500 MG tablet Take 1 tablet (500 mg total) by mouth 2 (two) times daily.   zinc sulfate 220 MG capsule Take 1 capsule (220 mg total) by mouth daily.   No current facility-administered medications for this visit. (Other)      REVIEW OF SYSTEMS: ROS   Negative for: Constitutional, Gastrointestinal, Neurological, Skin, Genitourinary, Musculoskeletal, HENT, Endocrine, Cardiovascular, Eyes, Respiratory, Psychiatric, Allergic/Imm, Heme/Lymph Last edited by Hurman Horn, MD on 05/15/2021 10:38 AM.       ALLERGIES Allergies  Allergen Reactions   Sulfa Antibiotics    Tramadol Nausea Only   Penicillins Rash    Has patient had a PCN reaction causing immediate rash,     PAST MEDICAL HISTORY Past Medical History:  Diagnosis Date   Acute duodenal ulcer with hemorrhage 01/18/2013   Acute gastric ulcers with hemorrhage 01/18/2013   Anemia    Aortic valve replaced    Arthritis    left hip   Asthma    hx of in childhood    Colon polyps    type not known, year(s) of occurrence not defined.    GERD (gastroesophageal reflux disease)    Heart murmur    History of transfusion    Hypertension    Nocturia    Pneumonia    Past Surgical  History:  Procedure Laterality Date   CARDIAC CATHETERIZATION     CATARACT EXTRACTION Bilateral    DILATION AND CURETTAGE OF UTERUS     x 2.   ESOPHAGOGASTRODUODENOSCOPY N/A 01/18/2013   Procedure: ESOPHAGOGASTRODUODENOSCOPY (EGD);  Surgeon: Gatha Mayer, MD;  Location: [facility] ENDOSCOPY;  Service: Endoscopy;  Laterality: N/A;   INCISION AND DRAINAGE HIP Left 01/09/2014   Procedure: IRRIGATION AND DEBRIDEMENT LEFT HIP;  Surgeon: Mcarthur Rossetti, MD;  Location: [facility];  Service: Orthopedics;  Laterality: Left;   TISSUE AORTIC VALVE REPLACEMENT  Nov 18 2012   at [facility] Left 12/23/2013    Procedure: TOTAL HIP ARTHROPLASTY ANTERIOR APPROACH, LEFT;  Surgeon: Mcarthur Rossetti, MD;  Location: [facility];  Service: Orthopedics;  Laterality: Left;   TOTAL HIP ARTHROPLASTY Right 08/18/2014   Procedure: RIGHT TOTAL HIP ARTHROPLASTY ANTERIOR APPROACH;  Surgeon: Mcarthur Rossetti, MD;  Location: [facility];  Service: Orthopedics;  Laterality: Right;    FAMILY HISTORY Family History  Problem Relation Age of Onset   Colon cancer Neg Hx    Breast cancer Neg Hx    Throat cancer Neg Hx    Stomach cancer Neg Hx     SOCIAL HISTORY Social History   Tobacco Use   Smoking status: Never   Smokeless tobacco: Never  Substance Use Topics   Alcohol use: No   Drug use: No         OPHTHALMIC EXAM:  Base Eye Exam     Visual Acuity (ETDRS)       Right Left   Dist cc HM 20/50   Dist ph cc NI NI         Tonometry (Tonopen, 10:41 AM)       Right Left   Pressure 15 12         Pupils       Pupils APD   Right PERRL None   Left PERRL None         Visual Fields       Left Right    Full Full         Extraocular Movement       Right Left    Full, Ortho Full, Ortho         Neuro/Psych     Oriented x3: Yes   Mood/Affect: Normal         Dilation     Both eyes: 1.0% Mydriacyl, 2.5% Phenylephrine @ 10:39 AM           Slit Lamp and Fundus Exam     External Exam       Right Left   External Normal Normal         Slit Lamp Exam       Right Left   Lids/Lashes Normal Normal   Conjunctiva/Sclera White and quiet White and quiet   Cornea Clear Clear   Anterior Chamber Deep and quiet Deep and quiet   Iris Round and reactive Round and reactive   Lens Posterior chamber intraocular lens, Open posterior capsule Posterior chamber intraocular lens, Open posterior capsule   Anterior Vitreous Normal Normal         Fundus Exam       Right Left   Posterior Vitreous Posterior vitreous detachment Posterior vitreous detachment   Disc Normal  Peripapillary atrophy   C/D Ratio 0.3 0.3   Macula Large subfoveal disciform scar, Disciform scar with chronic fluid overlying, Macular thickening, Cystoid macular edema  Atrophy, Macular atrophy, Retinal pigment epithelial atrophy, , no macular thickening, no hemorrhage, Soft drusen   Vessels Normal Normal   Periphery Normal Normal            IMAGING AND PROCEDURES  Imaging and Procedures for 05/15/21  OCT, Retina - OU - Both Eyes       Right Eye Quality was good. Scan locations included subfoveal. Central Foveal Thickness: 939. Progression has been stable. Findings include abnormal foveal contour, cystoid macular edema.   Left Eye Quality was good. Scan locations included subfoveal. Central Foveal Thickness: 228. Progression has been stable. Findings include vitreomacular adhesion .   Notes Improved macular anatomy OS overall, outer retinal atrophy and hyper reflective material in the fovea and the photoreceptor layer account for acuity.  At 5-month interval CNVM control.  OD not treatable lesion with chronic atrophy in the macula.  No enlargement of scotoma however             ASSESSMENT/PLAN:  Advanced nonexudative age-related macular degeneration of left eye without subfoveal involvement The nature of age--related macular degeneration was discussed with the patient as well as the distinction between dry and wet types. Checking an Amsler Grid daily with advice to return immediately should a distortion develop, was given to the patient. The patient 's smoking status now and in the past was determined and advice based on the AREDS study was provided regarding the consumption of antioxidant supplements. AREDS 2 vitamin formulation was recommended. Consumption of dark leafy vegetables and fresh fruits of various colors was recommended. Treatment modalities for wet macular degeneration particularly the use of intravitreal injections of anti-blood vessel growth factors was  discussed with the patient. Avastin, Lucentis, and Eylea are the available options. On occasion, therapy includes the use of photodynamic therapy and thermal laser. Stressed to the patient do not rub eyes.  Patient was advised to check Amsler Grid daily and return immediately if changes are noted. Instructions on using the grid were given to the patient. All patient questions were answered.  The nature of dry age related macular degeneration was discussed with the patient as well as its possible conversion to wet. The results of the AREDS 2 study was discussed with the patient. A diet rich in dark leafy green vegetables was advised and specific recommendations were made regarding supplements with AREDS 2 formulation . Control of hypertension and serum cholesterol may slow the disease. Smoking cessation is mandatory to slow the disease and diminish the risk of progressing to wet age related macular degeneration. The patient was instructed in the use of an Amsler Grid and was told to return immediately for any changes in the Grid. Stressed to the patient do not rub eyes  Exudative age-related macular degeneration of right eye with inactive choroidal neovascularization (HCC) OD with no signs of reactivation of CNVM will continue to monitor and observe  Exudative age-related macular degeneration of left eye with active choroidal neovascularization (HCC) At 105-month interval today that is 3 and half months post treatment effect, no sign of CNVM, in order to prevent or delay the development of atrophy, will continue to observe follow-up in 3 months     ICD-10-CM   1. Advanced nonexudative age-related macular degeneration of left eye without subfoveal involvement  H35.3123 OCT, Retina - OU - Both Eyes    2. Exudative age-related macular degeneration of left eye with active choroidal neovascularization (HCC)  H35.3221 OCT, Retina - OU - Both Eyes    3. Exudative age-related  macular degeneration of right eye  with inactive choroidal neovascularization (HCC)  H35.3212 OCT, Retina - OU - Both Eyes      1.  Findings reviewed today, will continue to monitor follow-up in 3 months to look for signs of recurrence.  If any changes develop in the interim patient to contact office promptly  2.  3.  Ophthalmic Meds Ordered this visit:  No orders of the defined types were placed in this encounter.      Return in about 3 months (around 08/15/2021) for DILATE OU, OCT.  There are no Patient Instructions on file for this visit.   Explained the diagnoses, plan, and follow up with the patient and they expressed understanding.  Patient expressed understanding of the importance of proper follow up care.   Clent Demark Khadejah Son M.D. Diseases & Surgery of the Retina and Vitreous [facility] [place] 05/15/21     Abbreviations: M myopia (nearsighted); A astigmatism; H hyperopia (farsighted); P presbyopia; Mrx spectacle prescription;  CTL contact lenses; OD right eye; OS left eye; OU both eyes  XT exotropia; ET esotropia; PEK punctate epithelial keratitis; PEE punctate epithelial erosions; DES dry eye syndrome; MGD meibomian gland dysfunction; ATs artificial tears; PFAT's preservative free artificial tears; [place] nuclear sclerotic cataract; PSC posterior subcapsular cataract; ERM epi-retinal membrane; PVD posterior vitreous detachment; RD retinal detachment; DM diabetes mellitus; DR diabetic retinopathy; NPDR non-proliferative diabetic retinopathy; PDR proliferative diabetic retinopathy; CSME clinically significant macular edema; DME diabetic macular edema; dbh dot blot hemorrhages; CWS cotton wool spot; POAG primary open angle glaucoma; C/D cup-to-disc ratio; HVF humphrey visual field; GVF goldmann visual field; OCT optical coherence tomography; IOP intraocular pressure; BRVO Branch retinal vein occlusion; CRVO central retinal vein occlusion; CRAO central retinal artery occlusion; BRAO branch retinal artery  occlusion; RT retinal tear; SB scleral buckle; PPV pars plana vitrectomy; VH Vitreous hemorrhage; PRP panretinal laser photocoagulation; IVK intravitreal kenalog; VMT vitreomacular traction; MH Macular hole;  NVD neovascularization of the disc; NVE neovascularization elsewhere; AREDS age related eye disease study; ARMD age related macular degeneration; POAG primary open angle glaucoma; EBMD epithelial/anterior basement membrane dystrophy; ACIOL anterior chamber intraocular lens; IOL intraocular lens; PCIOL posterior chamber intraocular lens; Phaco/IOL phacoemulsification with intraocular lens placement; [place] photorefractive keratectomy; LASIK laser assisted in situ keratomileusis; HTN hypertension; DM diabetes mellitus; COPD chronic obstructive pulmonary disease

## 2021-05-15 NOTE — Assessment & Plan Note (Signed)
The nature of age--related macular degeneration was discussed with the patient as well as the distinction between dry and wet types. Checking an Amsler Grid daily with advice to return immediately should a distortion develop, was given to the patient. The patient 's smoking status now and in the past was determined and advice based on the AREDS study was provided regarding the consumption of antioxidant supplements. AREDS 2 vitamin formulation was recommended. Consumption of dark leafy vegetables and fresh fruits of various colors was recommended. Treatment modalities for wet macular degeneration particularly the use of intravitreal injections of anti-blood vessel growth factors was discussed with the patient. Avastin, Lucentis, and Eylea are the available options. On occasion, therapy includes the use of photodynamic therapy and thermal laser. Stressed to the patient do not rub eyes.  Patient was advised to check Amsler Grid daily and return immediately if changes are noted. Instructions on using the grid were given to the patient. All patient questions were answered. ? ?The nature of dry age related macular degeneration was discussed with the patient as well as its possible conversion to wet. The results of the AREDS 2 study was discussed with the patient. A diet rich in dark leafy green vegetables was advised and specific recommendations were made regarding supplements with AREDS 2 formulation . Control of hypertension and serum cholesterol may slow the disease. Smoking cessation is mandatory to slow the disease and diminish the risk of progressing to wet age related macular degeneration. The patient was instructed in the use of an Amsler Grid and was told to return immediately for any changes in the Grid. Stressed to the patient do not rub eyes ?

## 2021-08-20 ENCOUNTER — Encounter (INDEPENDENT_AMBULATORY_CARE_PROVIDER_SITE_OTHER): Payer: Medicare Other | Admitting: Ophthalmology

## 2021-08-22 ENCOUNTER — Encounter (INDEPENDENT_AMBULATORY_CARE_PROVIDER_SITE_OTHER): Payer: Self-pay | Admitting: Ophthalmology

## 2021-08-22 ENCOUNTER — Ambulatory Visit (INDEPENDENT_AMBULATORY_CARE_PROVIDER_SITE_OTHER): Payer: Medicare Other | Admitting: Ophthalmology

## 2021-08-22 DIAGNOSIS — H353212 Exudative age-related macular degeneration, right eye, with inactive choroidal neovascularization: Secondary | ICD-10-CM | POA: Diagnosis not present

## 2021-08-22 DIAGNOSIS — H353221 Exudative age-related macular degeneration, left eye, with active choroidal neovascularization: Secondary | ICD-10-CM | POA: Diagnosis not present

## 2021-08-22 DIAGNOSIS — H43822 Vitreomacular adhesion, left eye: Secondary | ICD-10-CM | POA: Diagnosis not present

## 2021-08-22 NOTE — Assessment & Plan Note (Signed)
OS now off therapy for 8 months, previously no sign of recurrence.  Today clinically and confirmed by OCT a small intron subretinal hemorrhage adjacent to the atrophic central area of the fovea.  We will need to resume intravitreal Avastin OS today

## 2021-08-22 NOTE — Assessment & Plan Note (Signed)
Vitreomacular traction may cause vision loss from anatomic distortion to the center of the vision, the macula.  If visual function is symptomatic or threatened, therapy may be needed.  Surgical intervention offers the highest chance of visual stability and improvement.  Distortion of the macula anatomy may cause splitting of the retinal layers, termed foveomacular retinoschisis, which can cause more permanent vision loss.  Epiretinal membranes may also be associated.  Macular hole may also develop if vitreomacular traction progresses. The minor form of this condition is Vitreomacular adhesion, which is a natural change in the aging process of the eye, which requires observation only.  OS physiologic

## 2021-08-22 NOTE — Assessment & Plan Note (Signed)
Large subfoveal disciform scar °

## 2021-08-22 NOTE — Progress Notes (Signed)
08/22/2021     CHIEF COMPLAINT Patient presents for  Chief Complaint  Patient presents with   Macular Degeneration      HISTORY OF PRESENT ILLNESS: [name] is a 88 y.o. female who presents to the clinic today for:   HPI   3 mos fu OU OCT. Patient states vision is stable and unchanged since last visit. Denies any new floaters or FOL. Patient states "in the morning it seems to be hard to get my left eye open, there is a little crusty feeling sometimes." Patient reports she uses Blink eye drops occasionally. Denies new hospitalizations, surgeries, and falls since last visit. Patient reports she will be seeing Dr. Isaias Cowman June 26th. Last edited by Nelva Nay on 08/22/2021  2:11 PM.      Referring physician: Tsamis, Georganna Skeans, MD [address] [place],  [place] [address]  HISTORICAL INFORMATION:   Selected notes from the MEDICAL RECORD NUMBER       CURRENT MEDICATIONS: Current Outpatient Medications (Ophthalmic Drugs)  Medication Sig   Polyvinyl Alcohol-Povidone (REFRESH OP) Apply 1-2 drops to eye daily as needed (dry eyes.).   No current facility-administered medications for this visit. (Ophthalmic Drugs)   Current Outpatient Medications (Other)  Medication Sig   acetaminophen (TYLENOL) 325 MG tablet Take by mouth.   alendronate (FOSAMAX) 70 MG tablet Take 70 mg by mouth once a week. Take with a full glass of water on an empty stomach.   aspirin 81 MG tablet Take 81 mg by mouth daily.   Cholecalciferol (VITAMIN D3) 2000 UNITS capsule Take 2,000 Units by mouth every morning.   clindamycin (CLEOCIN) 300 MG capsule Take 1 capsule (300 mg total) by mouth 3 (three) times daily. (Patient not taking: Reported on 06/16/2016)   diclofenac sodium (VOLTAREN) 1 % GEL Apply 2-4 g topically 4 (four) times daily.   ferrous sulfate 325 (65 FE) MG tablet Take 1 tablet (325 mg total) by mouth 3 (three) times daily after meals. (Patient not taking: Reported on 06/16/2016)    metoprolol tartrate (LOPRESSOR) 25 MG tablet Take 25 mg by mouth every morning.   Multiple Vitamins-Minerals (ICAPS PO) Take 1 capsule by mouth every morning.   oxyCODONE-acetaminophen (PERCOCET/ROXICET) 5-325 MG tablet Take 1 tablet by mouth every 4 (four) hours as needed for severe pain. (Patient not taking: Reported on 06/16/2016)   pantoprazole (PROTONIX) 40 MG tablet Take 40 mg by mouth every morning.   saccharomyces boulardii (FLORASTOR) 250 MG capsule Take 250 mg by mouth every morning.   vitamin C (VITAMIN C) 500 MG tablet Take 1 tablet (500 mg total) by mouth 2 (two) times daily.   zinc sulfate 220 MG capsule Take 1 capsule (220 mg total) by mouth daily.   No current facility-administered medications for this visit. (Other)      REVIEW OF SYSTEMS: ROS   Negative for: Constitutional, Gastrointestinal, Neurological, Skin, Genitourinary, Musculoskeletal, HENT, Endocrine, Cardiovascular, Eyes, Respiratory, Psychiatric, Allergic/Imm, Heme/Lymph Last edited by Edmon Crape, MD on 08/22/2021  2:49 PM.       ALLERGIES Allergies  Allergen Reactions   Sulfa Antibiotics    Tramadol Nausea Only   Penicillins Rash    Has patient had a PCN reaction causing immediate rash,     PAST MEDICAL HISTORY Past Medical History:  Diagnosis Date   Acute duodenal ulcer with hemorrhage 01/18/2013   Acute gastric ulcers with hemorrhage 01/18/2013   Anemia    Aortic valve replaced    Arthritis  left hip   Asthma    hx of in childhood    Colon polyps    type not known, year(s) of occurrence not defined.    GERD (gastroesophageal reflux disease)    Heart murmur    History of transfusion    Hypertension    Nocturia    Pneumonia    Past Surgical History:  Procedure Laterality Date   CARDIAC CATHETERIZATION     CATARACT EXTRACTION Bilateral    DILATION AND CURETTAGE OF UTERUS     x 2.   ESOPHAGOGASTRODUODENOSCOPY N/A 01/18/2013   Procedure: ESOPHAGOGASTRODUODENOSCOPY (EGD);   Surgeon: Iva Boop, MD;  Location: [facility] ENDOSCOPY;  Service: Endoscopy;  Laterality: N/A;   INCISION AND DRAINAGE HIP Left 01/09/2014   Procedure: IRRIGATION AND DEBRIDEMENT LEFT HIP;  Surgeon: Kathryne Hitch, MD;  Location: [facility] OR;  Service: Orthopedics;  Laterality: Left;   TISSUE AORTIC VALVE REPLACEMENT  Nov 18 2012   at [facility]   TOTAL HIP ARTHROPLASTY Left 12/23/2013   Procedure: TOTAL HIP ARTHROPLASTY ANTERIOR APPROACH, LEFT;  Surgeon: Kathryne Hitch, MD;  Location: [facility] ORS;  Service: Orthopedics;  Laterality: Left;   TOTAL HIP ARTHROPLASTY Right 08/18/2014   Procedure: RIGHT TOTAL HIP ARTHROPLASTY ANTERIOR APPROACH;  Surgeon: Kathryne Hitch, MD;  Location: [facility] ORS;  Service: Orthopedics;  Laterality: Right;    FAMILY HISTORY Family History  Problem Relation Age of Onset   Colon cancer Neg Hx    Breast cancer Neg Hx    Throat cancer Neg Hx    Stomach cancer Neg Hx     SOCIAL HISTORY Social History   Tobacco Use   Smoking status: Never   Smokeless tobacco: Never  Substance Use Topics   Alcohol use: No   Drug use: No         OPHTHALMIC EXAM:  Base Eye Exam     Visual Acuity (ETDRS)       Right Left   Dist cc HM 20/50 -2   Dist ph cc  NI    Correction: Glasses         Tonometry (Tonopen, 2:11 PM)       Right Left   Pressure 13 11         Pupils       Pupils Dark Light APD   Right PERRL 3 2 None   Left PERRL 3 2 None         Extraocular Movement       Right Left    Full Full         Neuro/Psych     Oriented x3: Yes   Mood/Affect: Normal         Dilation     Both eyes: 1.0% Mydriacyl, 2.5% Phenylephrine @ 2:11 PM           Slit Lamp and Fundus Exam     External Exam       Right Left   External Normal Normal         Slit Lamp Exam       Right Left   Lids/Lashes Normal Normal   Conjunctiva/Sclera White and quiet White and quiet   Cornea Clear Clear   Anterior Chamber Deep and quiet  Deep and quiet   Iris Round and reactive Round and reactive   Lens Posterior chamber intraocular lens, Open posterior capsule Posterior chamber intraocular lens, Open posterior capsule   Anterior Vitreous Normal Normal  Fundus Exam       Right Left   Posterior Vitreous Posterior vitreous detachment Posterior vitreous detachment   Disc Normal Peripapillary atrophy   C/D Ratio 0.3 0.3   Macula Large subfoveal disciform scar, Disciform scar with chronic fluid overlying, Macular thickening, Cystoid macular edema Atrophy, Macular atrophy, Retinal pigment epithelial atrophy, , no macular thickening, Soft drusen, Subretinal hemorrhage new at superior edge of the FAZ and atrophy.   Vessels Normal Normal   Periphery Normal Normal           Refraction     Wearing Rx     Age: 76 yr            IMAGING AND PROCEDURES  Imaging and Procedures for 08/26/21  OCT, Retina - OU - Both Eyes       Right Eye Quality was good. Scan locations included subfoveal. Central Foveal Thickness: 939. Progression has been stable. Findings include abnormal foveal contour, cystoid macular edema.   Left Eye Quality was good. Scan locations included subfoveal. Central Foveal Thickness: 225. Progression has worsened. Findings include vitreomacular adhesion .   Notes Slightly worse macular anatomy OS overall, outer retinal atrophy and hyper reflective material in the fovea and the photoreceptor layer account for acuity.  Thickening in the superonasal to FAZ, 62-month interval CNVM thus new recurrence OS  OD not treatable lesion with chronic atrophy in the macula.  No enlargement of scotoma however     Intravitreal Injection, Pharmacologic Agent - OS - Left Eye       Time Out 08/22/2021. 10:30 AM. Confirmed correct patient, procedure, site, and patient consented.   Anesthesia Topical anesthesia was used. Anesthetic medications included Lidocaine 4%, Akten 3.5%.   Procedure Preparation  included 5% betadine to ocular surface, 10% betadine to eyelids, Tobramycin 0.3%, Ofloxacin . A 30 gauge needle was used.   Injection: 2.5 mg bevacizumab 2.5 MG/0.1ML   Route: Intravitreal, Site: Left Eye   NDC: [phone], Lot: [id number], Expiration date: 10/06/2021   Post-op Post injection exam found visual acuity of at least counting fingers. The patient tolerated the procedure well. There were no complications. The patient received written and verbal post procedure care education. Post injection medications were not given.              ASSESSMENT/PLAN:  Exudative age-related macular degeneration of left eye with active choroidal neovascularization (HCC) OS now off therapy for 8 months, previously no sign of recurrence.  Today clinically and confirmed by OCT a small intron subretinal hemorrhage adjacent to the atrophic central area of the fovea.  We will need to resume intravitreal Avastin OS today  Vitreomacular adhesion, left Vitreomacular traction may cause vision loss from anatomic distortion to the center of the vision, the macula.  If visual function is symptomatic or threatened, therapy may be needed.  Surgical intervention offers the highest chance of visual stability and improvement.  Distortion of the macula anatomy may cause splitting of the retinal layers, termed foveomacular retinoschisis, which can cause more permanent vision loss.  Epiretinal membranes may also be associated.  Macular hole may also develop if vitreomacular traction progresses. The minor form of this condition is Vitreomacular adhesion, which is a natural change in the aging process of the eye, which requires observation only.  OS physiologic  Exudative age-related macular degeneration of right eye with inactive choroidal neovascularization (HCC) Large subfoveal disciform scar     ICD-10-CM   1. Exudative age-related macular degeneration of left eye with active choroidal  neovascularization (HCC)   H35.3221 OCT, Retina - OU - Both Eyes    Intravitreal Injection, Pharmacologic Agent - OS - Left Eye    bevacizumab (AVASTIN) SOSY 2.5 mg    2. Vitreomacular adhesion, left  H43.822     3. Exudative age-related macular degeneration of right eye with inactive choroidal neovascularization (HCC)  H35.3212       1.  OS recurrence of subretinal hemorrhage superonasal aspect to FAZ and atrophy thus signifying clinical recurrence of small CNVM with preserved acuity.  We will repeat and restart Avastin OS today and reevaluate next in 6 to 8 weeks  2.  OD does not require therapy at this time for large disciform scar  3.  OS observe physiologic VMA  Ophthalmic Meds Ordered this visit:  Meds ordered this encounter  Medications   bevacizumab (AVASTIN) SOSY 2.5 mg       Return in about 8 weeks (around 10/17/2021) for dilate, OS, AVASTIN OCT.  There are no Patient Instructions on file for this visit.   Explained the diagnoses, plan, and follow up with the patient and they expressed understanding.  Patient expressed understanding of the importance of proper follow up care.   Alford [place] Daiel Strohecker M.D. Diseases & Surgery of the Retina and Vitreous [facility] 08/26/21     Abbreviations: M myopia (nearsighted); A astigmatism; H hyperopia (farsighted); P presbyopia; Mrx spectacle prescription;  CTL contact lenses; OD right eye; OS left eye; OU both eyes  XT exotropia; ET esotropia; PEK punctate epithelial keratitis; PEE punctate epithelial erosions; DES dry eye syndrome; MGD meibomian gland dysfunction; ATs artificial tears; PFAT's preservative free artificial tears; NSC nuclear sclerotic cataract; PSC posterior subcapsular cataract; ERM epi-retinal membrane; PVD posterior vitreous detachment; RD retinal detachment; DM diabetes mellitus; DR diabetic retinopathy; NPDR non-proliferative diabetic retinopathy; PDR proliferative diabetic retinopathy; CSME clinically significant macular  edema; DME diabetic macular edema; dbh dot blot hemorrhages; CWS cotton wool spot; POAG primary open angle glaucoma; C/D cup-to-disc ratio; HVF humphrey visual field; GVF goldmann visual field; OCT optical coherence tomography; IOP intraocular pressure; BRVO Branch retinal vein occlusion; CRVO central retinal vein occlusion; CRAO central retinal artery occlusion; BRAO branch retinal artery occlusion; RT retinal tear; SB scleral buckle; PPV pars plana vitrectomy; VH Vitreous hemorrhage; PRP panretinal laser photocoagulation; IVK intravitreal kenalog; VMT vitreomacular traction; MH Macular hole;  NVD neovascularization of the disc; NVE neovascularization elsewhere; AREDS age related eye disease study; ARMD age related macular degeneration; POAG primary open angle glaucoma; EBMD epithelial/anterior basement membrane dystrophy; ACIOL anterior chamber intraocular lens; IOL intraocular lens; PCIOL posterior chamber intraocular lens; Phaco/IOL phacoemulsification with intraocular lens placement; PRK photorefractive keratectomy; LASIK laser assisted in situ keratomileusis; HTN hypertension; DM diabetes mellitus; COPD chronic obstructive pulmonary disease

## 2021-08-26 DIAGNOSIS — H353221 Exudative age-related macular degeneration, left eye, with active choroidal neovascularization: Secondary | ICD-10-CM | POA: Diagnosis not present

## 2021-08-26 MED ORDER — BEVACIZUMAB 2.5 MG/0.1ML IZ SOSY
2.5000 mg | PREFILLED_SYRINGE | INTRAVITREAL | Status: AC | PRN
  Administered 2021-08-26: 2.5 mg via INTRAVITREAL

## 2021-08-26 NOTE — Addendum Note (Signed)
Addended by: Fawn Kirk A on: 08/26/2021 08:17 AM   Modules accepted: Orders

## 2021-10-17 ENCOUNTER — Encounter (INDEPENDENT_AMBULATORY_CARE_PROVIDER_SITE_OTHER): Payer: Medicare Other | Admitting: Ophthalmology

## 2021-10-31 ENCOUNTER — Encounter (INDEPENDENT_AMBULATORY_CARE_PROVIDER_SITE_OTHER): Payer: Medicare Other | Admitting: Ophthalmology

## 2021-11-05 ENCOUNTER — Encounter (INDEPENDENT_AMBULATORY_CARE_PROVIDER_SITE_OTHER): Payer: Self-pay | Admitting: Ophthalmology

## 2021-11-05 ENCOUNTER — Ambulatory Visit (INDEPENDENT_AMBULATORY_CARE_PROVIDER_SITE_OTHER): Payer: Medicare Other | Admitting: Ophthalmology

## 2021-11-05 DIAGNOSIS — H353124 Nonexudative age-related macular degeneration, left eye, advanced atrophic with subfoveal involvement: Secondary | ICD-10-CM

## 2021-11-05 DIAGNOSIS — H353221 Exudative age-related macular degeneration, left eye, with active choroidal neovascularization: Secondary | ICD-10-CM

## 2021-11-05 DIAGNOSIS — H353212 Exudative age-related macular degeneration, right eye, with inactive choroidal neovascularization: Secondary | ICD-10-CM | POA: Diagnosis not present

## 2021-11-05 MED ORDER — BEVACIZUMAB CHEMO INJECTION 1.25MG/0.05ML SYRINGE FOR KALEIDOSCOPE
1.2500 mg | INTRAVITREAL | Status: AC | PRN
  Administered 2021-11-05: 1.25 mg via INTRAVITREAL

## 2021-11-05 NOTE — Assessment & Plan Note (Signed)
OD stable with massive subfoveal scarring and geographic atrophy as well as atrophy of the macular retinal structures, not treatable

## 2021-11-05 NOTE — Assessment & Plan Note (Signed)
OS remained stable at 10-1/2 weeks post an evaluation and injection.  We will repeat injection today reevaluate next in 14 weeks

## 2021-11-05 NOTE — Assessment & Plan Note (Signed)
Atrophy accounts for acuity OS 

## 2021-11-05 NOTE — Progress Notes (Signed)
11/05/2021     CHIEF COMPLAINT Patient presents for  Chief Complaint  Patient presents with   Macular Degeneration      HISTORY OF PRESENT ILLNESS: [name] is a 88 y.o. female who presents to the clinic today for:   HPI   10.5  weeks for DILATE OS, AVASTIN OCT. Pt stated vision has remained stable. However, pt c/o "the same white that seems to have worsened".   Last edited by Edmon Crape, MD on 11/05/2021  2:20 PM.      Referring [place]: [place] [address] [place],  [place] [address]  HISTORICAL INFORMATION:   Selected notes from the MEDICAL RECORD NUMBER       CURRENT MEDICATIONS: Current Outpatient Medications (Ophthalmic Drugs)  Medication Sig   Polyvinyl Alcohol-Povidone (REFRESH OP) Apply 1-2 drops to eye daily as needed (dry eyes.).   No current facility-administered medications for this visit. (Ophthalmic Drugs)   Current Outpatient Medications (Other)  Medication Sig   acetaminophen (TYLENOL) 325 MG tablet Take by mouth.   alendronate (FOSAMAX) 70 MG tablet Take 70 mg by mouth once a week. Take with a full glass of water on an empty stomach.   aspirin 81 MG tablet Take 81 mg by mouth daily.   Cholecalciferol (VITAMIN D3) 2000 UNITS capsule Take 2,000 Units by mouth every morning.   clindamycin (CLEOCIN) 300 MG capsule Take 1 capsule (300 mg total) by mouth 3 (three) times daily. (Patient not taking: Reported on 06/16/2016)   diclofenac sodium (VOLTAREN) 1 % GEL Apply 2-4 g topically 4 (four) times daily.   ferrous sulfate 325 (65 FE) MG tablet Take 1 tablet (325 mg total) by mouth 3 (three) times daily after meals. (Patient not taking: Reported on 06/16/2016)   metoprolol tartrate (LOPRESSOR) 25 MG tablet Take 25 mg by mouth every morning.   Multiple Vitamins-Minerals (ICAPS PO) Take 1 capsule by mouth every morning.   oxyCODONE-acetaminophen (PERCOCET/ROXICET) 5-325 MG tablet Take 1 tablet by mouth every 4 (four) hours as needed for  severe pain. (Patient not taking: Reported on 06/16/2016)   pantoprazole (PROTONIX) 40 MG tablet Take 40 mg by mouth every morning.   saccharomyces boulardii (FLORASTOR) 250 MG capsule Take 250 mg by mouth every morning.   vitamin C (VITAMIN C) 500 MG tablet Take 1 tablet (500 mg total) by mouth 2 (two) times daily.   zinc sulfate 220 MG capsule Take 1 capsule (220 mg total) by mouth daily.   No current facility-administered medications for this visit. (Other)      REVIEW OF SYSTEMS: ROS   Negative for: Constitutional, Gastrointestinal, Neurological, Skin, Genitourinary, Musculoskeletal, HENT, Endocrine, Cardiovascular, Eyes, Respiratory, Psychiatric, Allergic/Imm, Heme/Lymph Last edited by Angeline Slim on 11/05/2021  2:02 PM.       ALLERGIES Allergies  Allergen Reactions   Sulfa Antibiotics    Tramadol Nausea Only   Penicillins Rash    Has patient had a PCN reaction causing immediate rash,     PAST MEDICAL HISTORY Past Medical History:  Diagnosis Date   Acute duodenal ulcer with hemorrhage 01/18/2013   Acute gastric ulcers with hemorrhage 01/18/2013   Anemia    Aortic valve replaced    Arthritis    left hip   Asthma    hx of in childhood    Colon polyps    type not known, year(s) of occurrence not defined.    GERD (gastroesophageal reflux disease)    Heart murmur  History of transfusion    Hypertension    Nocturia    Pneumonia    Past Surgical History:  Procedure Laterality Date   CARDIAC CATHETERIZATION     CATARACT EXTRACTION Bilateral    DILATION AND CURETTAGE OF UTERUS     x 2.   ESOPHAGOGASTRODUODENOSCOPY N/A 01/18/2013   Procedure: ESOPHAGOGASTRODUODENOSCOPY (EGD);  Surgeon: Gatha Mayer, MD;  Location: [facility] ENDOSCOPY;  Service: Endoscopy;  Laterality: N/A;   INCISION AND DRAINAGE HIP Left 01/09/2014   Procedure: IRRIGATION AND DEBRIDEMENT LEFT HIP;  Surgeon: Mcarthur Rossetti, MD;  Location: [facility];  Service: Orthopedics;  Laterality: Left;   TISSUE  AORTIC VALVE REPLACEMENT  Nov 18 2012   at [facility] Left 12/23/2013   Procedure: TOTAL HIP ARTHROPLASTY ANTERIOR APPROACH, LEFT;  Surgeon: Mcarthur Rossetti, MD;  Location: [facility];  Service: Orthopedics;  Laterality: Left;   TOTAL HIP ARTHROPLASTY Right 08/18/2014   Procedure: RIGHT TOTAL HIP ARTHROPLASTY ANTERIOR APPROACH;  Surgeon: Mcarthur Rossetti, MD;  Location: [facility];  Service: Orthopedics;  Laterality: Right;    FAMILY HISTORY Family History  Problem Relation Age of Onset   Colon cancer Neg Hx    Breast cancer Neg Hx    Throat cancer Neg Hx    Stomach cancer Neg Hx     SOCIAL HISTORY Social History   Tobacco Use   Smoking status: Never   Smokeless tobacco: Never  Substance Use Topics   Alcohol use: No   Drug use: No         OPHTHALMIC EXAM:  Base Eye Exam     Visual Acuity (ETDRS)       Right Left   Dist cc HM 20/50 -2   Dist ph cc  NI    Correction: Glasses         Tonometry (Tonopen, 2:06 PM)       Right Left   Pressure 10 11         Pupils       Pupils APD   Right PERRL None   Left PERRL None         Visual Fields       Left Right   Restrictions  Total superior temporal deficiency         Extraocular Movement       Right Left    Full, Ortho Full, Ortho         Neuro/Psych     Oriented x3: Yes   Mood/Affect: Normal         Dilation     Left eye: 2.5% Phenylephrine, 1.0% Mydriacyl @ 2:06 PM           Slit Lamp and Fundus Exam     External Exam       Right Left   External Normal Normal         Slit Lamp Exam       Right Left   Lids/Lashes Normal Normal   Conjunctiva/Sclera White and quiet White and quiet   Cornea Clear Clear   Anterior Chamber Deep and quiet Deep and quiet   Iris Round and reactive Round and reactive   Lens Posterior chamber intraocular lens, Open posterior capsule Posterior chamber intraocular lens, Open posterior capsule   Anterior Vitreous  Normal Normal         Fundus Exam       Right Left   Posterior Vitreous  Posterior vitreous detachment   Disc  Peripapillary atrophy   C/D Ratio  0.3   Macula  Atrophy, Macular atrophy, Retinal pigment epithelial atrophy, , no macular thickening, Soft drusen, Subretinal hemorrhage new at superior edge of the FAZ and atrophy.   Vessels  Normal   Periphery  Normal            IMAGING AND PROCEDURES  Imaging and Procedures for 11/05/21  OCT, Retina - OU - Both Eyes       Right Eye Quality was good. Scan locations included subfoveal. Central Foveal Thickness: 903. Progression has been stable. Findings include abnormal foveal contour, cystoid macular edema.   Left Eye Quality was good. Scan locations included subfoveal. Central Foveal Thickness: 210. Progression has been stable. Findings include vitreomacular adhesion .   Notes Stable macular anatomy OS overall, outer retinal atrophy and hyper reflective material in the fovea and the photoreceptor layer account for acuity.  Thickening in the superonasal to FAZ, 55-month interval CNVM thus new recurrence OS  OD not treatable lesion with chronic atrophy in the macula.  No enlargement of scotoma however      Intravitreal Injection, Pharmacologic Agent - OS - Left Eye       Time Out 11/05/2021. 2:21 PM. Confirmed correct patient, procedure, site, and patient consented.   Anesthesia Topical anesthesia was used. Anesthetic medications included Lidocaine 4%, Akten 3.5%.   Procedure Preparation included 5% betadine to ocular surface, 10% betadine to eyelids, Tobramycin 0.3%, Ofloxacin . A 30 gauge needle was used.   Injection: 1.25 mg Bevacizumab 1.25mg /0.2ml   Route: Intravitreal, Site: Left Eye   NDC: [id number]   Post-op Post injection exam found visual acuity of at least counting fingers. The patient tolerated the procedure well. There were no complications. The patient received written and verbal post procedure care  education. Post injection medications were not given.              ASSESSMENT/PLAN:  Advanced nonexudative age-related macular degeneration of left eye with subfoveal involvement Atrophy accounts for acuity OS  Exudative age-related macular degeneration of left eye with active choroidal neovascularization (HCC) OS remained stable at 10-1/2 weeks post an evaluation and injection.  We will repeat injection today reevaluate next in 14 weeks  Exudative age-related macular degeneration of right eye with inactive choroidal neovascularization ([place]) OD stable with massive subfoveal scarring and geographic atrophy as well as atrophy of the macular retinal structures, not treatable     ICD-10-CM   1. Exudative age-related macular degeneration of left eye with active choroidal neovascularization (HCC)  H35.3221 OCT, Retina - OU - Both Eyes    Intravitreal Injection, Pharmacologic Agent - OS - Left Eye    Bevacizumab (AVASTIN) SOLN 1.25 mg    2. Advanced nonexudative age-related macular degeneration of left eye with subfoveal involvement  H35.3124     3. Exudative age-related macular degeneration of right eye with inactive choroidal neovascularization ([place])  H35.3212       1.  Large subfoveal cicatricial scar right eye with overlying macular atrophy, not treatable.  Likely accounts for occasional flashing lights but centrally.  Dilate OU next  2.  OS, with subfoveal dry AMD limiting acuity.  VMA is also present OS but not pathologic at this time  OS stabilized to wet AMD at 10.5-week interval today repeat injection today for maintenance  3.  Dilate OU next consider Avastin OS at 14-week evaluation  Ophthalmic Meds Ordered this visit:  Meds ordered this encounter  Medications   Bevacizumab (AVASTIN) SOLN 1.25  mg       Return in about 14 weeks (around 02/11/2022) for DILATE OU, AVASTIN OCT, OS.  There are no Patient Instructions on file for this visit.   Explained the diagnoses,  plan, and follow up with the patient and they expressed understanding.  Patient expressed understanding of the importance of proper follow up care.   Alford Highland Guliana Weyandt M.D. Diseases & Surgery of the Retina and Vitreous [facility] 11/05/21     Abbreviations: M myopia (nearsighted); A astigmatism; H hyperopia (farsighted); P presbyopia; Mrx spectacle prescription;  CTL contact lenses; OD right eye; OS left eye; OU both eyes  XT exotropia; ET esotropia; PEK punctate epithelial keratitis; PEE punctate epithelial erosions; DES dry eye syndrome; MGD meibomian gland dysfunction; ATs artificial tears; PFAT's preservative free artificial tears; NSC nuclear sclerotic cataract; PSC posterior subcapsular cataract; ERM epi-retinal membrane; PVD posterior vitreous detachment; RD retinal detachment; DM diabetes mellitus; DR diabetic retinopathy; NPDR non-proliferative diabetic retinopathy; PDR proliferative diabetic retinopathy; CSME clinically significant macular edema; DME diabetic macular edema; dbh dot blot hemorrhages; CWS cotton wool spot; POAG primary open angle glaucoma; C/D cup-to-disc ratio; HVF humphrey visual field; GVF goldmann visual field; OCT optical coherence tomography; IOP intraocular pressure; BRVO Branch retinal vein occlusion; CRVO central retinal vein occlusion; CRAO central retinal artery occlusion; BRAO branch retinal artery occlusion; RT retinal tear; SB scleral buckle; PPV pars plana vitrectomy; VH Vitreous hemorrhage; PRP panretinal laser photocoagulation; IVK intravitreal kenalog; VMT vitreomacular traction; MH Macular hole;  NVD neovascularization of the disc; NVE neovascularization elsewhere; AREDS age related eye disease study; ARMD age related macular degeneration; POAG primary open angle glaucoma; EBMD epithelial/anterior basement membrane dystrophy; ACIOL anterior chamber intraocular lens; IOL intraocular lens; PCIOL posterior chamber intraocular lens; Phaco/IOL  phacoemulsification with intraocular lens placement; PRK photorefractive keratectomy; LASIK laser assisted in situ keratomileusis; HTN hypertension; DM diabetes mellitus; COPD chronic obstructive pulmonary disease

## 2022-02-11 ENCOUNTER — Encounter (INDEPENDENT_AMBULATORY_CARE_PROVIDER_SITE_OTHER): Payer: Medicare Other | Admitting: Ophthalmology

## 2022-04-09 ENCOUNTER — Encounter (INDEPENDENT_AMBULATORY_CARE_PROVIDER_SITE_OTHER): Payer: Medicare Other | Admitting: Ophthalmology

## 2022-04-09 DIAGNOSIS — H43813 Vitreous degeneration, bilateral: Secondary | ICD-10-CM | POA: Diagnosis not present

## 2022-04-09 DIAGNOSIS — H353231 Exudative age-related macular degeneration, bilateral, with active choroidal neovascularization: Secondary | ICD-10-CM | POA: Diagnosis not present

## 2022-04-09 DIAGNOSIS — H35033 Hypertensive retinopathy, bilateral: Secondary | ICD-10-CM | POA: Diagnosis not present

## 2022-04-09 DIAGNOSIS — I10 Essential (primary) hypertension: Secondary | ICD-10-CM | POA: Diagnosis not present

## 2022-06-02 ENCOUNTER — Encounter (INDEPENDENT_AMBULATORY_CARE_PROVIDER_SITE_OTHER): Payer: Medicare Other | Admitting: Ophthalmology

## 2022-06-02 DIAGNOSIS — I10 Essential (primary) hypertension: Secondary | ICD-10-CM

## 2022-06-02 DIAGNOSIS — H353231 Exudative age-related macular degeneration, bilateral, with active choroidal neovascularization: Secondary | ICD-10-CM | POA: Diagnosis not present

## 2022-06-02 DIAGNOSIS — H35033 Hypertensive retinopathy, bilateral: Secondary | ICD-10-CM | POA: Diagnosis not present

## 2022-06-02 DIAGNOSIS — H43813 Vitreous degeneration, bilateral: Secondary | ICD-10-CM | POA: Diagnosis not present

## 2022-07-30 ENCOUNTER — Encounter (INDEPENDENT_AMBULATORY_CARE_PROVIDER_SITE_OTHER): Payer: Medicare Other | Admitting: Ophthalmology

## 2022-07-30 DIAGNOSIS — H43813 Vitreous degeneration, bilateral: Secondary | ICD-10-CM | POA: Diagnosis not present

## 2022-07-30 DIAGNOSIS — I10 Essential (primary) hypertension: Secondary | ICD-10-CM | POA: Diagnosis not present

## 2022-07-30 DIAGNOSIS — H353231 Exudative age-related macular degeneration, bilateral, with active choroidal neovascularization: Secondary | ICD-10-CM

## 2022-07-30 DIAGNOSIS — H35033 Hypertensive retinopathy, bilateral: Secondary | ICD-10-CM | POA: Diagnosis not present

## 2022-09-24 ENCOUNTER — Encounter (INDEPENDENT_AMBULATORY_CARE_PROVIDER_SITE_OTHER): Payer: Medicare Other | Admitting: Ophthalmology

## 2022-09-24 DIAGNOSIS — H33302 Unspecified retinal break, left eye: Secondary | ICD-10-CM | POA: Diagnosis not present

## 2022-09-24 DIAGNOSIS — H353231 Exudative age-related macular degeneration, bilateral, with active choroidal neovascularization: Secondary | ICD-10-CM

## 2022-09-24 DIAGNOSIS — H35033 Hypertensive retinopathy, bilateral: Secondary | ICD-10-CM

## 2022-09-24 DIAGNOSIS — H43813 Vitreous degeneration, bilateral: Secondary | ICD-10-CM | POA: Diagnosis not present

## 2022-09-24 DIAGNOSIS — I10 Essential (primary) hypertension: Secondary | ICD-10-CM | POA: Diagnosis not present

## 2022-11-19 ENCOUNTER — Encounter (INDEPENDENT_AMBULATORY_CARE_PROVIDER_SITE_OTHER): Payer: Medicare Other | Admitting: Ophthalmology

## 2022-11-19 DIAGNOSIS — H43813 Vitreous degeneration, bilateral: Secondary | ICD-10-CM | POA: Diagnosis not present

## 2022-11-19 DIAGNOSIS — H353231 Exudative age-related macular degeneration, bilateral, with active choroidal neovascularization: Secondary | ICD-10-CM

## 2022-11-19 DIAGNOSIS — H35033 Hypertensive retinopathy, bilateral: Secondary | ICD-10-CM | POA: Diagnosis not present

## 2022-11-19 DIAGNOSIS — I10 Essential (primary) hypertension: Secondary | ICD-10-CM | POA: Diagnosis not present

## 2022-11-19 DIAGNOSIS — H33302 Unspecified retinal break, left eye: Secondary | ICD-10-CM | POA: Diagnosis not present

## 2023-01-14 ENCOUNTER — Encounter (INDEPENDENT_AMBULATORY_CARE_PROVIDER_SITE_OTHER): Payer: Medicare Other | Admitting: Ophthalmology

## 2023-01-14 DIAGNOSIS — H33302 Unspecified retinal break, left eye: Secondary | ICD-10-CM

## 2023-01-14 DIAGNOSIS — I10 Essential (primary) hypertension: Secondary | ICD-10-CM | POA: Diagnosis not present

## 2023-01-14 DIAGNOSIS — H35033 Hypertensive retinopathy, bilateral: Secondary | ICD-10-CM | POA: Diagnosis not present

## 2023-01-14 DIAGNOSIS — H353231 Exudative age-related macular degeneration, bilateral, with active choroidal neovascularization: Secondary | ICD-10-CM | POA: Diagnosis not present

## 2023-01-14 DIAGNOSIS — H43813 Vitreous degeneration, bilateral: Secondary | ICD-10-CM | POA: Diagnosis not present

## 2023-03-18 ENCOUNTER — Encounter (INDEPENDENT_AMBULATORY_CARE_PROVIDER_SITE_OTHER): Payer: Medicare Other | Admitting: Ophthalmology

## 2023-03-18 DIAGNOSIS — I10 Essential (primary) hypertension: Secondary | ICD-10-CM

## 2023-03-18 DIAGNOSIS — H353231 Exudative age-related macular degeneration, bilateral, with active choroidal neovascularization: Secondary | ICD-10-CM

## 2023-03-18 DIAGNOSIS — H33302 Unspecified retinal break, left eye: Secondary | ICD-10-CM

## 2023-03-18 DIAGNOSIS — H35033 Hypertensive retinopathy, bilateral: Secondary | ICD-10-CM | POA: Diagnosis not present

## 2023-03-18 DIAGNOSIS — H43813 Vitreous degeneration, bilateral: Secondary | ICD-10-CM

## 2023-05-20 ENCOUNTER — Encounter (INDEPENDENT_AMBULATORY_CARE_PROVIDER_SITE_OTHER): Payer: Medicare Other | Admitting: Ophthalmology

## 2023-05-20 DIAGNOSIS — D3132 Benign neoplasm of left choroid: Secondary | ICD-10-CM | POA: Diagnosis not present

## 2023-05-20 DIAGNOSIS — H353231 Exudative age-related macular degeneration, bilateral, with active choroidal neovascularization: Secondary | ICD-10-CM | POA: Diagnosis not present

## 2023-05-20 DIAGNOSIS — H35033 Hypertensive retinopathy, bilateral: Secondary | ICD-10-CM | POA: Diagnosis not present

## 2023-05-20 DIAGNOSIS — I10 Essential (primary) hypertension: Secondary | ICD-10-CM

## 2023-05-20 DIAGNOSIS — H43813 Vitreous degeneration, bilateral: Secondary | ICD-10-CM

## 2023-07-23 ENCOUNTER — Encounter (INDEPENDENT_AMBULATORY_CARE_PROVIDER_SITE_OTHER): Admitting: Ophthalmology

## 2023-07-23 DIAGNOSIS — H35033 Hypertensive retinopathy, bilateral: Secondary | ICD-10-CM | POA: Diagnosis not present

## 2023-07-23 DIAGNOSIS — H33302 Unspecified retinal break, left eye: Secondary | ICD-10-CM

## 2023-07-23 DIAGNOSIS — H43813 Vitreous degeneration, bilateral: Secondary | ICD-10-CM | POA: Diagnosis not present

## 2023-07-23 DIAGNOSIS — H353231 Exudative age-related macular degeneration, bilateral, with active choroidal neovascularization: Secondary | ICD-10-CM | POA: Diagnosis not present

## 2023-07-23 DIAGNOSIS — I10 Essential (primary) hypertension: Secondary | ICD-10-CM | POA: Diagnosis not present

## 2023-09-24 ENCOUNTER — Encounter (INDEPENDENT_AMBULATORY_CARE_PROVIDER_SITE_OTHER): Admitting: Ophthalmology

## 2023-09-24 DIAGNOSIS — H43813 Vitreous degeneration, bilateral: Secondary | ICD-10-CM

## 2023-09-24 DIAGNOSIS — H35033 Hypertensive retinopathy, bilateral: Secondary | ICD-10-CM

## 2023-09-24 DIAGNOSIS — H353231 Exudative age-related macular degeneration, bilateral, with active choroidal neovascularization: Secondary | ICD-10-CM | POA: Diagnosis not present

## 2023-09-24 DIAGNOSIS — H33302 Unspecified retinal break, left eye: Secondary | ICD-10-CM

## 2023-09-24 DIAGNOSIS — I10 Essential (primary) hypertension: Secondary | ICD-10-CM

## 2023-11-26 ENCOUNTER — Encounter (INDEPENDENT_AMBULATORY_CARE_PROVIDER_SITE_OTHER): Admitting: Ophthalmology

## 2023-11-26 DIAGNOSIS — I10 Essential (primary) hypertension: Secondary | ICD-10-CM | POA: Diagnosis not present

## 2023-11-26 DIAGNOSIS — H353231 Exudative age-related macular degeneration, bilateral, with active choroidal neovascularization: Secondary | ICD-10-CM

## 2023-11-26 DIAGNOSIS — H33302 Unspecified retinal break, left eye: Secondary | ICD-10-CM | POA: Diagnosis not present

## 2023-11-26 DIAGNOSIS — H43813 Vitreous degeneration, bilateral: Secondary | ICD-10-CM

## 2023-11-26 DIAGNOSIS — H35033 Hypertensive retinopathy, bilateral: Secondary | ICD-10-CM

## 2024-01-28 ENCOUNTER — Encounter (INDEPENDENT_AMBULATORY_CARE_PROVIDER_SITE_OTHER): Admitting: Ophthalmology

## 2024-01-28 DIAGNOSIS — I10 Essential (primary) hypertension: Secondary | ICD-10-CM | POA: Diagnosis not present

## 2024-01-28 DIAGNOSIS — H43813 Vitreous degeneration, bilateral: Secondary | ICD-10-CM | POA: Diagnosis not present

## 2024-01-28 DIAGNOSIS — H353231 Exudative age-related macular degeneration, bilateral, with active choroidal neovascularization: Secondary | ICD-10-CM | POA: Diagnosis not present

## 2024-01-28 DIAGNOSIS — H35033 Hypertensive retinopathy, bilateral: Secondary | ICD-10-CM | POA: Diagnosis not present

## 2024-03-28 ENCOUNTER — Encounter (INDEPENDENT_AMBULATORY_CARE_PROVIDER_SITE_OTHER): Admitting: Ophthalmology

## 2024-03-28 DIAGNOSIS — H353231 Exudative age-related macular degeneration, bilateral, with active choroidal neovascularization: Secondary | ICD-10-CM | POA: Diagnosis not present

## 2024-03-28 DIAGNOSIS — H43813 Vitreous degeneration, bilateral: Secondary | ICD-10-CM

## 2024-03-28 DIAGNOSIS — H35033 Hypertensive retinopathy, bilateral: Secondary | ICD-10-CM | POA: Diagnosis not present

## 2024-03-28 DIAGNOSIS — I10 Essential (primary) hypertension: Secondary | ICD-10-CM

## 2024-03-28 DIAGNOSIS — H33302 Unspecified retinal break, left eye: Secondary | ICD-10-CM

## 2024-05-09 ENCOUNTER — Encounter (INDEPENDENT_AMBULATORY_CARE_PROVIDER_SITE_OTHER): Admitting: Ophthalmology
